# Patient Record
Sex: Male | Born: 1965 | Race: Asian | Hispanic: No | Marital: Married | Smoking: Current some day smoker
Health system: Southern US, Community
[De-identification: ages and names within clinical notes are randomized; demographics above are authoritative.]

## PROBLEM LIST (undated history)

## (undated) DIAGNOSIS — Z720 Tobacco use: Secondary | ICD-10-CM

## (undated) DIAGNOSIS — E119 Type 2 diabetes mellitus without complications: Secondary | ICD-10-CM

## (undated) DIAGNOSIS — I10 Essential (primary) hypertension: Secondary | ICD-10-CM

## (undated) HISTORY — PX: APPENDECTOMY: SHX54

---

## 2004-02-04 ENCOUNTER — Emergency Department (HOSPITAL_COMMUNITY): Admission: EM | Admit: 2004-02-04 | Discharge: 2004-02-04 | Payer: Self-pay | Admitting: *Deleted

## 2017-11-05 ENCOUNTER — Ambulatory Visit
Admission: RE | Admit: 2017-11-05 | Discharge: 2017-11-05 | Disposition: A | Discharge status: Home / Self Care | Payer: BLUE CROSS/BLUE SHIELD | Source: Ambulatory Visit | Attending: Family Medicine | Admitting: Family Medicine

## 2017-11-05 ENCOUNTER — Other Ambulatory Visit: Payer: Self-pay | Admitting: Family Medicine

## 2017-11-05 DIAGNOSIS — R634 Abnormal weight loss: Secondary | ICD-10-CM

## 2017-11-05 DIAGNOSIS — L98499 Non-pressure chronic ulcer of skin of other sites with unspecified severity: Secondary | ICD-10-CM

## 2017-11-09 ENCOUNTER — Other Ambulatory Visit: Payer: Self-pay | Admitting: Family Medicine

## 2017-11-09 DIAGNOSIS — R911 Solitary pulmonary nodule: Secondary | ICD-10-CM

## 2017-11-11 ENCOUNTER — Encounter (HOSPITAL_COMMUNITY): Payer: Self-pay | Admitting: Emergency Medicine

## 2017-11-11 ENCOUNTER — Other Ambulatory Visit: Payer: Self-pay | Admitting: Internal Medicine

## 2017-11-11 ENCOUNTER — Emergency Department (HOSPITAL_COMMUNITY): Payer: BLUE CROSS/BLUE SHIELD

## 2017-11-11 ENCOUNTER — Other Ambulatory Visit: Payer: Self-pay

## 2017-11-11 ENCOUNTER — Inpatient Hospital Stay (HOSPITAL_COMMUNITY)
Admission: EM | Admit: 2017-11-11 | Discharge: 2017-11-15 | DRG: 194 | Disposition: A | Discharge status: Home / Self Care | Payer: BLUE CROSS/BLUE SHIELD | Attending: Internal Medicine | Admitting: Internal Medicine

## 2017-11-11 DIAGNOSIS — R000 Tachycardia, unspecified: Secondary | ICD-10-CM | POA: Diagnosis present

## 2017-11-11 DIAGNOSIS — A150 Tuberculosis of lung: Secondary | ICD-10-CM | POA: Diagnosis present

## 2017-11-11 DIAGNOSIS — R918 Other nonspecific abnormal finding of lung field: Secondary | ICD-10-CM | POA: Diagnosis not present

## 2017-11-11 DIAGNOSIS — R634 Abnormal weight loss: Secondary | ICD-10-CM | POA: Diagnosis not present

## 2017-11-11 DIAGNOSIS — J168 Pneumonia due to other specified infectious organisms: Principal | ICD-10-CM | POA: Diagnosis present

## 2017-11-11 DIAGNOSIS — A158 Other respiratory tuberculosis: Secondary | ICD-10-CM | POA: Diagnosis not present

## 2017-11-11 DIAGNOSIS — Z681 Body mass index (BMI) 19 or less, adult: Secondary | ICD-10-CM | POA: Diagnosis not present

## 2017-11-11 DIAGNOSIS — I10 Essential (primary) hypertension: Secondary | ICD-10-CM | POA: Diagnosis present

## 2017-11-11 DIAGNOSIS — R059 Cough, unspecified: Secondary | ICD-10-CM | POA: Diagnosis present

## 2017-11-11 DIAGNOSIS — F17210 Nicotine dependence, cigarettes, uncomplicated: Secondary | ICD-10-CM | POA: Diagnosis present

## 2017-11-11 DIAGNOSIS — Z716 Tobacco abuse counseling: Secondary | ICD-10-CM

## 2017-11-11 DIAGNOSIS — Z833 Family history of diabetes mellitus: Secondary | ICD-10-CM

## 2017-11-11 DIAGNOSIS — E119 Type 2 diabetes mellitus without complications: Secondary | ICD-10-CM | POA: Diagnosis present

## 2017-11-11 DIAGNOSIS — F17200 Nicotine dependence, unspecified, uncomplicated: Secondary | ICD-10-CM | POA: Diagnosis not present

## 2017-11-11 DIAGNOSIS — E876 Hypokalemia: Secondary | ICD-10-CM | POA: Diagnosis present

## 2017-11-11 DIAGNOSIS — R05 Cough: Secondary | ICD-10-CM | POA: Diagnosis not present

## 2017-11-11 DIAGNOSIS — R5381 Other malaise: Secondary | ICD-10-CM | POA: Diagnosis present

## 2017-11-11 DIAGNOSIS — E440 Moderate protein-calorie malnutrition: Secondary | ICD-10-CM | POA: Diagnosis present

## 2017-11-11 DIAGNOSIS — J189 Pneumonia, unspecified organism: Secondary | ICD-10-CM

## 2017-11-11 DIAGNOSIS — J181 Lobar pneumonia, unspecified organism: Secondary | ICD-10-CM | POA: Diagnosis not present

## 2017-11-11 DIAGNOSIS — Z720 Tobacco use: Secondary | ICD-10-CM | POA: Diagnosis present

## 2017-11-11 HISTORY — DX: Essential (primary) hypertension: I10

## 2017-11-11 HISTORY — DX: Type 2 diabetes mellitus without complications: E11.9

## 2017-11-11 HISTORY — DX: Tobacco use: Z72.0

## 2017-11-11 LAB — URINALYSIS, ROUTINE W REFLEX MICROSCOPIC
BILIRUBIN URINE: NEGATIVE
GLUCOSE, UA: NEGATIVE mg/dL
HGB URINE DIPSTICK: NEGATIVE
KETONES UR: NEGATIVE mg/dL
LEUKOCYTES UA: NEGATIVE
Nitrite: NEGATIVE
PH: 8 (ref 5.0–8.0)
Protein, ur: NEGATIVE mg/dL
Specific Gravity, Urine: 1.002 — ABNORMAL LOW (ref 1.005–1.030)

## 2017-11-11 LAB — CBC WITH DIFFERENTIAL/PLATELET
BASOS ABS: 0.1 10*3/uL (ref 0.0–0.1)
Basophils Relative: 1 %
Eosinophils Absolute: 0.8 10*3/uL — ABNORMAL HIGH (ref 0.0–0.7)
Eosinophils Relative: 8 %
HEMATOCRIT: 34.5 % — AB (ref 39.0–52.0)
Hemoglobin: 11.2 g/dL — ABNORMAL LOW (ref 13.0–17.0)
LYMPHS ABS: 1.7 10*3/uL (ref 0.7–4.0)
LYMPHS PCT: 17 %
MCH: 29.8 pg (ref 26.0–34.0)
MCHC: 32.5 g/dL (ref 30.0–36.0)
MCV: 91.8 fL (ref 78.0–100.0)
MONO ABS: 0.6 10*3/uL (ref 0.1–1.0)
Monocytes Relative: 6 %
Neutro Abs: 6.9 10*3/uL (ref 1.7–7.7)
Neutrophils Relative %: 68 %
Platelets: 488 10*3/uL — ABNORMAL HIGH (ref 150–400)
RBC: 3.76 MIL/uL — ABNORMAL LOW (ref 4.22–5.81)
RDW: 14.6 % (ref 11.5–15.5)
WBC: 10.1 10*3/uL (ref 4.0–10.5)

## 2017-11-11 LAB — COMPREHENSIVE METABOLIC PANEL
ALBUMIN: 3.5 g/dL (ref 3.5–5.0)
ALK PHOS: 72 U/L (ref 38–126)
ALT: 20 U/L (ref 17–63)
AST: 26 U/L (ref 15–41)
Anion gap: 11 (ref 5–15)
BUN: 7 mg/dL (ref 6–20)
CALCIUM: 9.6 mg/dL (ref 8.9–10.3)
CO2: 26 mmol/L (ref 22–32)
CREATININE: 0.84 mg/dL (ref 0.61–1.24)
Chloride: 99 mmol/L — ABNORMAL LOW (ref 101–111)
GFR calc Af Amer: >60 mL/min (ref >60)
GFR calc non Af Amer: >60 mL/min (ref >60)
GLUCOSE: 114 mg/dL — AB (ref 65–99)
Potassium: 3.4 mmol/L — ABNORMAL LOW (ref 3.5–5.1)
SODIUM: 136 mmol/L (ref 135–145)
Total Bilirubin: 0.6 mg/dL (ref 0.3–1.2)
Total Protein: 7.5 g/dL (ref 6.5–8.1)

## 2017-11-11 LAB — LACTIC ACID, PLASMA: Lactic Acid, Venous: 1.2 mmol/L (ref 0.5–1.9)

## 2017-11-11 LAB — CBG MONITORING, ED: GLUCOSE-CAPILLARY: 92 mg/dL (ref 65–99)

## 2017-11-11 LAB — I-STAT TROPONIN, ED: Troponin i, poc: 0 ng/mL (ref 0.00–0.08)

## 2017-11-11 MED ORDER — INSULIN ASPART 100 UNIT/ML ~~LOC~~ SOLN: 0.0000 [IU] | Freq: Every day | SUBCUTANEOUS | Status: DC

## 2017-11-11 MED ORDER — INSULIN ASPART 100 UNIT/ML ~~LOC~~ SOLN
0.0000 [IU] | Freq: Three times a day (TID) | SUBCUTANEOUS | Status: DC
  Administered 2017-11-12 – 2017-11-14 (×3): 2 [IU] via SUBCUTANEOUS
  Administered 2017-11-15: 3 [IU] via SUBCUTANEOUS
  Filled 2017-11-11: qty 1

## 2017-11-11 MED ORDER — ALBUTEROL SULFATE (2.5 MG/3ML) 0.083% IN NEBU
2.5000 mg | INHALATION_SOLUTION | Freq: Four times a day (QID) | RESPIRATORY_TRACT | Status: DC | PRN
Start: 2017-11-11 — End: 2017-11-15

## 2017-11-11 MED ORDER — ENSURE ENLIVE PO LIQD
237.0000 mL | Freq: Two times a day (BID) | ORAL | Status: DC
  Administered 2017-11-12 (×2): 237 mL via ORAL
  Filled 2017-11-11: qty 237

## 2017-11-11 MED ORDER — POTASSIUM CHLORIDE 20 MEQ/15ML (10%) PO SOLN
20.0000 meq | Freq: Once | ORAL | Status: AC
  Administered 2017-11-11: 20 meq via ORAL
  Filled 2017-11-11: qty 15

## 2017-11-11 MED ORDER — ACETAMINOPHEN 325 MG PO TABS: 650.0000 mg | ORAL_TABLET | Freq: Four times a day (QID) | ORAL | Status: DC | PRN

## 2017-11-11 MED ORDER — SODIUM CHLORIDE 0.9 % IV SOLN
500.0000 mg | INTRAVENOUS | Status: DC
  Administered 2017-11-12: 500 mg via INTRAVENOUS
  Filled 2017-11-11 (×3): qty 500

## 2017-11-11 MED ORDER — HYDRALAZINE HCL 20 MG/ML IJ SOLN: 5.0000 mg | INTRAMUSCULAR | Status: DC | PRN

## 2017-11-11 MED ORDER — SODIUM CHLORIDE 0.9 % IV SOLN
INTRAVENOUS | Status: DC
  Administered 2017-11-11 – 2017-11-14 (×5): via INTRAVENOUS

## 2017-11-11 MED ORDER — NICOTINE 21 MG/24HR TD PT24
21.0000 mg | MEDICATED_PATCH | Freq: Every day | TRANSDERMAL | Status: DC
  Filled 2017-11-11 (×2): qty 1

## 2017-11-11 MED ORDER — DM-GUAIFENESIN ER 30-600 MG PO TB12
1.0000 | ORAL_TABLET | Freq: Two times a day (BID) | ORAL | Status: DC | PRN
  Filled 2017-11-11: qty 1

## 2017-11-11 MED ORDER — SODIUM CHLORIDE 0.9 % IV SOLN
1.0000 g | INTRAVENOUS | Status: DC
  Administered 2017-11-12: 1 g via INTRAVENOUS
  Filled 2017-11-11 (×2): qty 10

## 2017-11-11 MED ORDER — ZOLPIDEM TARTRATE 5 MG PO TABS: 5.0000 mg | ORAL_TABLET | Freq: Every evening | ORAL | Status: DC | PRN

## 2017-11-11 MED ORDER — SODIUM CHLORIDE 0.9 % IV BOLUS
1000.0000 mL | Freq: Once | INTRAVENOUS | Status: AC
Start: 2017-11-11 — End: 2017-11-11
  Administered 2017-11-11: 1000 mL via INTRAVENOUS

## 2017-11-11 MED ORDER — SODIUM CHLORIDE 0.9 % IV SOLN
1.0000 g | Freq: Once | INTRAVENOUS | Status: AC
  Administered 2017-11-11: 1 g via INTRAVENOUS
  Filled 2017-11-11: qty 10

## 2017-11-11 MED ORDER — SODIUM CHLORIDE 0.9 % IV SOLN
500.0000 mg | Freq: Once | INTRAVENOUS | Status: AC
  Administered 2017-11-11: 500 mg via INTRAVENOUS
  Filled 2017-11-11: qty 500

## 2017-11-11 MED ORDER — ONDANSETRON HCL 4 MG/2ML IJ SOLN: 4.0000 mg | Freq: Three times a day (TID) | INTRAMUSCULAR | Status: DC | PRN

## 2017-11-11 MED ORDER — ENOXAPARIN SODIUM 40 MG/0.4ML ~~LOC~~ SOLN
40.0000 mg | Freq: Every day | SUBCUTANEOUS | Status: DC
  Administered 2017-11-12 – 2017-11-15 (×4): 40 mg via SUBCUTANEOUS
  Filled 2017-11-11 (×3): qty 0.4

## 2017-11-11 NOTE — ED Provider Notes (Signed)
Patient placed in Quick Look pathway, seen and evaluated   Chief Complaint: Cough  HPI:   Carles Collethong Delmonico is a 52 y.o. male, presenting to the ED with persistent cough, especially in the morning for the last 4 weeks. Patient traveled back to [place] and was in [place] from November 2018 until the end of January 2019.  He also endorses 20 pound weight loss in the last month as well as anorexia.   ROS: Cough, abnormal chest CT (one)  Physical Exam:   Gen: No distress  Neuro: Awake and Alert  Skin: Warm    Focused Exam:   No noted increased work of breathing. General wasting appearance.    CT with contrast performed today through [facility]: IMPRESSION: 1. Consolidations, confluent opacities, and groundglass nodules bilaterally. There is a large consolidation involving the right upper lobe measuring 6.4 x 5.6 cm with central cavitation. Consolidations involving the superior aspect of the right lower  lobe also demonstrate regions of central cavitation. These findings are most consistent with a multifocal pneumonia, potentially fungal or TB   Initiation of care has begun. The patient has been counseled on the process, plan, and necessity for staying for the completion/evaluation, and the remainder of the medical screening examination   Concepcion LivingJoy, Dartanion Teo C, PA-C 11/11/17 1806    Azalia Bilisampos, Kevin, MD 11/11/17 2330

## 2017-11-11 NOTE — H&P (Signed)
History and Physical    Tyler Hansen UJW:[id number] DOB: September 11, 1965 DOA: 11/11/2017  Referring MD/NP/PA:   PCP: Kaleen Mask, MD   Patient coming from:  The patient is coming from home.  At baseline, pt is independent for most of ADL.  Chief Complaint: cough and weight loss  HPI: Tyler Hansen is a 52 y.o. male with medical history significant of  Hypertension, diabetes mellitus, who presents with cough and weight loss.  Pt speaks [place] and dose not speak [place]. The history was obtained using Using [employer] [place]. Patient states that he has been having cough for more than 4 weeks. He does not have fever, but has intermittent chills. Denies any chest pain or SOB. No runny nose, sore throat. He has malaise. He has poor appetite and and lost 20 pounds recently. Pt was seen by his PCP and had CT scan done.   CT with contrast performed today through [facility] showed: Consolidations, confluent opacities, and groundglass nodules bilaterally. There is a large consolidation involving the right upper lobe measuring 6.4 x 5.6 cm with central cavitation. Consolidations involving the superior aspect of the right lower lobe also demonstrate regions of central cavitation. These findings are most consistent with a multifocal pneumonia, potentially fungal or TB  He was sent to ED for further evaluation and treatment. Patient states that he traveled to [place] and stayed there from 06/2017 to the end of 08/2017. Patient does not have nausea, vomiting, diarrhea, abdominal pain. No symptoms of UTI or unilateral weakness. He states that there is no family members having tuberculosis.  ED Course: pt was found to have WBC 10.1, negative troponin, negative urinalysis, potassium 3.4, creatinine normal, temperature 99, tachycardia, tachypnea, oxygen saturation sexual 100% on room air. Chest x-ray showed right upper lobe infiltration. Patient is admitted to MedSurg bed (negative pressure room) as  inpt. Infectious disease, Dr. Luciana Axe was consulted.  Review of Systems:   General: no fevers, has chills, body weight loss, has poor appetite, has fatigue HEENT: no blurry vision, hearing changes or sore throat Respiratory: no dyspnea, has coughing, no wheezing CV: no chest pain, no palpitations GI: no nausea, vomiting, abdominal pain, diarrhea, constipation GU: no dysuria, burning on urination, increased urinary frequency, hematuria  Ext: no leg edema Neuro: no unilateral weakness, numbness, or tingling, no vision change or hearing loss Skin: no rash, no skin tear. MSK: No muscle spasm, no deformity, no limitation of range of movement in spin Heme: No easy bruising.  Travel history: No recent long distant travel.  Allergy: No Known Allergies  Past Medical History:  Diagnosis Date  . Diabetes mellitus without complication (HCC)   . Hypertension   . Tobacco abuse     Past Surgical History:  Procedure Laterality Date  . APPENDECTOMY  20 years ago    Social History:  reports that he has been smoking.  He has never used smokeless tobacco. He reports that he drank alcohol. He reports that he does not use drugs.  Family History:  Family History  Problem Relation Age of Onset  . Diabetes Mother   . Diabetes Father      Prior to Admission medications   Not on File    Physical Exam: Vitals:   11/11/17 1636 11/11/17 1700  BP: (!) 134/94   Pulse: (!) 116   Resp: 16   Temp: 99 F (37.2 C)   TempSrc: Oral   SpO2: 100%   Weight:  50.8 kg (112 lb)  Height:  5\' 6"  (  1.676 m)   General: Not in acute distress HEENT:       Eyes: PERRL, EOMI, no scleral icterus.       ENT: No discharge from the ears and nose, no pharynx injection, no tonsillar enlargement.        Neck: No JVD, no bruit, no mass felt. Heme: No neck lymph node enlargement. Cardiac: S1/S2, RRR, No murmurs, No gallops or rubs. Respiratory: No rales, wheezing, rhonchi or rubs. GI: Soft, nondistended, nontender,  no rebound pain, no organomegaly, BS present. GU: No hematuria Ext: No pitting leg edema bilaterally. 2+DP/PT pulse bilaterally. Musculoskeletal: No joint deformities, No joint redness or warmth, no limitation of ROM in spin. Skin: No rashes.  Neuro: Alert, oriented X3, cranial nerves II-XII grossly intact, moves all extremities normally.  Psych: Patient is not psychotic, no suicidal or hemocidal ideation.  Labs on Admission: I have personally reviewed following labs and imaging studies  CBC: Recent Labs  Lab 11/11/17 1721  WBC 10.1  NEUTROABS 6.9  HGB 11.2*  HCT 34.5*  MCV 91.8  PLT 488*   Basic Metabolic Panel: Recent Labs  Lab 11/11/17 1721  NA 136  K 3.4*  CL 99*  CO2 26  GLUCOSE 114*  BUN 7  CREATININE 0.84  CALCIUM 9.6   GFR: Estimated Creatinine Clearance: 73.1 mL/min (by C-G formula based on SCr of 0.84 mg/dL). Liver Function Tests: Recent Labs  Lab 11/11/17 1721  AST 26  ALT 20  ALKPHOS 72  BILITOT 0.6  PROT 7.5  ALBUMIN 3.5   No results for input(s): LIPASE, AMYLASE in the last 168 hours. No results for input(s): AMMONIA in the last 168 hours. Coagulation Profile: No results for input(s): INR, PROTIME in the last 168 hours. Cardiac Enzymes: No results for input(s): CKTOTAL, CKMB, CKMBINDEX, TROPONINI in the last 168 hours. BNP (last 3 results) No results for input(s): PROBNP in the last 8760 hours. HbA1C: No results for input(s): HGBA1C in the last 72 hours. CBG: No results for input(s): GLUCAP in the last 168 hours. Lipid Profile: No results for input(s): CHOL, HDL, LDLCALC, TRIG, CHOLHDL, LDLDIRECT in the last 72 hours. Thyroid Function Tests: No results for input(s): TSH, T4TOTAL, FREET4, T3FREE, THYROIDAB in the last 72 hours. Anemia Panel: No results for input(s): VITAMINB12, FOLATE, FERRITIN, TIBC, IRON, RETICCTPCT in the last 72 hours. Urine analysis:    Component Value Date/Time   COLORURINE STRAW (A) 11/11/2017 1744    APPEARANCEUR CLEAR 11/11/2017 1744   LABSPEC 1.002 (L) 11/11/2017 1744   PHURINE 8.0 11/11/2017 1744   GLUCOSEU NEGATIVE 11/11/2017 1744   HGBUR NEGATIVE 11/11/2017 1744   BILIRUBINUR NEGATIVE 11/11/2017 1744   KETONESUR NEGATIVE 11/11/2017 1744   PROTEINUR NEGATIVE 11/11/2017 1744   NITRITE NEGATIVE 11/11/2017 1744   LEUKOCYTESUR NEGATIVE 11/11/2017 1744   Sepsis Labs: @LABRCNTIP (procalcitonin:4,lacticidven:4) )No results found for this or any previous visit (from the past 240 hour(s)).   Radiological Exams on Admission: Dg Chest Portable 1 View  Result Date: 11/11/2017 CLINICAL DATA:  Cough for 4 weeks. 20 pound weight loss. Recent travel to [place] in November 2018. EXAM: PORTABLE CHEST 1 VIEW COMPARISON:  11/05/2017 FINDINGS: There is persistent right upper lobe airspace consolidation and volume loss with retraction of the right hilum. Possible right hilar adenopathy. Multiple other nodular densities are noted in the left lung. No significant change. Slight improved aeration of the right lung base. No pleural effusions. IMPRESSION: Persistent right upper lobe airspace process and scattered nodular lung opacities. Findings could be  due to atypical infection such as TB. Neoplasm is also possible. Electronically Signed   By: Rudie Meyer M.D.   On: 11/11/2017 18:24     EKG: Independently reviewed.  Sinus rhythm, QTC 472, no ischemic change.  Assessment/Plan Principal Problem:   Cough Active Problems:   Hypertension   Diabetes mellitus without complication (HCC)   Weight loss   Tobacco abuse   Protein-calorie malnutrition, moderate (HCC)   Hypokalemia   Cough: pt has been coughing for 4 weeks with significant weight loss. CT of the chest showed infiltration with cavity, very concerning for TB, atypical pneumonia or fungal pneumonia. Infectious disease, Dr. Aundria Mems was consulted by EDP. He recommended symptomatic treatment at this time will cover with azithromycin and Rocephin and  get AFB sputum cultures as well as QuantiFERON. Patient does not have leukocytosis and fever. He has tachycardia, but clinically does not seem to be septic. Hemodynamically stable.  - will admit to Med-surg as inpt (negative pressure room - IV Rocephin and azithromycin - Mucinex for cough  - prn Albuterol Nebs for SOB - Urine legionella and S. pneumococcal antigen - Follow up blood culture x2, sputum culture and respiratory virus panel, plus Flu pcr - lactic acid level  - IVF: 1.0 L of NS bolus in ED, followed by 125 mL per hour of NS  -f/u  AFB sputum cultures  And QuantiFERON.  HTN: Patient states that he isn taking blood pressure medications at home, but doesn't know the name of medication. Blood pressure is 124/94 -when necessary hydralazine IV  Diabetes mellitus without complication (HCC): no A1c on record.  Patient states that he isn taking DM  medications at home, but doesn't know the name of medication. -SSI -check a1c  Weight loss and Protein-calorie malnutrition, moderate: -Ensure and consult nutrition  Tobacco abuse: -Did counseling about importance of quitting smoking -Nicotine patch  Hypokalemia: K= 3.4 on admission. - Repleted  DVT ppx: SQ Lovenox Code Status: Full code Family Communication:   Yes, patient's  wife  at bed side Disposition Plan:  Anticipate discharge back to previous home environment Consults called:  ID, Dr. Luciana Axe Admission status:  medical floor/inpt  Date of Service 11/11/2017    Lorretta Harp [facility] Pager [phone]  If 7PM-7AM, please contact night-coverage [email or web address] Password [email or web address] 11/11/2017, 8:20 PM

## 2017-11-11 NOTE — ED Triage Notes (Signed)
Using [place] [employer]. Pt states that his doctor recommended him to come here because he has an infection in his lungs. Pt states he went to the doctor because he was rapid losing weight (20 lbs) and poor appetite so his doctor did some blood work and then sent for a scan after. He has had a cough in the morning for 4 weeks,  denies pain or shortness of breath at this time.

## 2017-11-11 NOTE — ED Provider Notes (Signed)
[facility] EMERGENCY DEPARTMENT Provider Note   CSN: [id number] Arrival date & time: 11/11/17  1534     History   Chief Complaint Chief Complaint  Patient presents with  . Abnormal Imaging    HPI Tyler Hansen is a 52 y.o. male.  Patient is a 52 year old [place] with a past medical history of diabetes and hypertension who is presenting today with a history of 1 month of cough with questionable sputum, general malaise, 20 pound weight loss, anorexia.  Patient was seen by his PCP earlier this week and had x-rays that showed questionable cavitary lesion then went on to have a CT done today which showed consolidation with cavitary lesion questionable multifocal pneumonia versus TB versus fungal infection.  Significant history was patient travel back to [place] from November through January.  Patient was not ill while he was in country and illness developed once he returned to the [place].  Patient does smoke cigarettes.  He has shortness of breath with exertion but denies resting dyspnea or chest pain.  Patient has a minimal white sputum production but denies any hemoptysis  The history is provided by the patient. The history is limited by a language barrier. A language interpreter was used.    Past Medical History:  Diagnosis Date  . Diabetes mellitus without complication (HCC)   . Hypertension     There are no active problems to display for this patient.   Past Surgical History:  Procedure Laterality Date  . APPENDECTOMY  20 years ago        Home Medications    Prior to Admission medications   Not on File    Family History Family History  Problem Relation Age of Onset  . Diabetes Mother   . Diabetes Father     Social History Social History   Tobacco Use  . Smoking status: Current Some Day Smoker  . Smokeless tobacco: Never Used  Substance Use Topics  . Alcohol use: Not Currently  . Drug use: Not Currently     Allergies     Patient has no allergy information on record.   Review of Systems Review of Systems  All other systems reviewed and are negative.    Physical Exam Updated Vital Signs BP (!) 134/94 (BP Location: Right Arm)   Pulse (!) 116   Temp 99 F (37.2 C) (Oral)   Resp 16   Ht 5\' 6"  (1.676 m)   Wt 50.8 kg (112 lb)   SpO2 100%   BMI 18.08 kg/m   Physical Exam  Constitutional: He is oriented to person, place, and time. He appears well-developed. He appears cachectic. No distress.  HENT:  Head: Normocephalic and atraumatic.  Mouth/Throat: Oropharynx is clear and moist.  Eyes: Pupils are equal, round, and reactive to light. Conjunctivae and EOM are normal.  Neck: Normal range of motion. Neck supple.  Cardiovascular: Regular rhythm and intact distal pulses. Tachycardia present.  No murmur heard. Pulmonary/Chest: Effort normal. No respiratory distress. He has no wheezes. He has rhonchi in the right upper field. He has no rales.  Abdominal: Soft. He exhibits no distension. There is no tenderness. There is no rebound and no guarding.  Musculoskeletal: Normal range of motion. He exhibits no edema or tenderness.  Neurological: He is alert and oriented to person, place, and time.  Skin: Skin is warm and dry. No rash noted. No erythema.  Psychiatric: He has a normal mood and affect. His behavior is normal.  Nursing  note and vitals reviewed.    ED Treatments / Results  Labs (all labs ordered are listed, but only abnormal results are displayed) Labs Reviewed  CBC WITH DIFFERENTIAL/PLATELET - Abnormal; Notable for the following components:      Result Value   RBC 3.76 (*)    Hemoglobin 11.2 (*)    HCT 34.5 (*)    Platelets 488 (*)    Eosinophils Absolute 0.8 (*)    All other components within normal limits  COMPREHENSIVE METABOLIC PANEL - Abnormal; Notable for the following components:   Potassium 3.4 (*)    Chloride 99 (*)    Glucose, Bld 114 (*)    All other components within normal  limits  CULTURE, BLOOD (ROUTINE X 2)  CULTURE, BLOOD (ROUTINE X 2)  URINE CULTURE  ACID FAST SMEAR (AFB)  ACID FAST CULTURE WITH REFLEXED SENSITIVITIES  FUNGUS CULTURE WITH STAIN  CULTURE, EXPECTORATED SPUTUM-ASSESSMENT  HIV ANTIBODY (ROUTINE TESTING)  URINALYSIS, ROUTINE W REFLEX MICROSCOPIC  QUANTIFERON-TB GOLD PLUS  I-STAT TROPONIN, ED  I-STAT CG4 LACTIC ACID, ED    EKG EKG Interpretation  Date/Time:  Thursday November 11 2017 18:37:54 EDT Ventricular Rate:  101 PR Interval:    QRS Duration: 88 QT Interval:  364 QTC Calculation: 472 R Axis:   63 Text Interpretation:  Sinus tachycardia Baseline wander in lead(s) V3 No previous tracing Confirmed by Gwyneth Sprout ([id number]) on 11/11/2017 6:59:35 PM   Radiology Dg Chest Portable 1 View  Result Date: 11/11/2017 CLINICAL DATA:  Cough for 4 weeks. 20 pound weight loss. Recent travel to [place] in November 2018. EXAM: PORTABLE CHEST 1 VIEW COMPARISON:  11/05/2017 FINDINGS: There is persistent right upper lobe airspace consolidation and volume loss with retraction of the right hilum. Possible right hilar adenopathy. Multiple other nodular densities are noted in the left lung. No significant change. Slight improved aeration of the right lung base. No pleural effusions. IMPRESSION: Persistent right upper lobe airspace process and scattered nodular lung opacities. Findings could be due to atypical infection such as TB. Neoplasm is also possible. Electronically Signed   By: Rudie Meyer M.D.   On: 11/11/2017 18:24    Procedures Procedures (including critical care time)  Medications Ordered in ED Medications  cefTRIAXone (ROCEPHIN) 1 g in sodium chloride 0.9 % 100 mL IVPB (has no administration in time range)  azithromycin (ZITHROMAX) 500 mg in sodium chloride 0.9 % 250 mL IVPB (has no administration in time range)     Initial Impression / Assessment and Plan / ED Course  I have reviewed the triage vital signs and the nursing  notes.  Pertinent labs & imaging results that were available during my care of the patient were reviewed by me and considered in my medical decision making (see chart for details).  Clinical Course as of Nov 11 1804  Thu Nov 11, 2017  1721 Spoke with Dr. Luciana Axe, infectious disease.  Differential includes multifocal pneumonia, fungal pneumonia, cancer, or TB. Recommends fungal, bacterial, and AFB sputum cultures.  QuantiFERON. Otherwise assess and treat clinically.  He is happy to follow-up with the patient in the office following discharge.   [SJ]    Clinical Course User Index [SJ] Joy, Shawn C, PA-C    Patient presenting today with a known cavitary lesion in his right upper lobe question for multifocal pneumonia versus fungal pneumonia versus TB.  Patient does have significant risk factors for TB as he spent November through January and [place] in addition to being a smoker, and  diabetic.  Spoke with Dr. Luciana Axeomer with infectious disease who recommended symptomatic treatment at this time will cover with azithromycin and Rocephin.  However also recommended doing AFB sputum cultures as well as a QuantiFERON.  Patient was placed in a negative pressure room.  Labs are pending but plan will be to admit.  7:11 PM Cbc wnl, trop neg, CMP wnl.  Started on rocephin and azithro  Final Clinical Impressions(s) / ED Diagnoses   Final diagnoses:  Multifocal pneumonia    ED Discharge Orders    None       Gwyneth SproutPlunkett, Nesa Distel, MD 11/11/17 [phone]

## 2017-11-12 DIAGNOSIS — R05 Cough: Secondary | ICD-10-CM

## 2017-11-12 LAB — BASIC METABOLIC PANEL
ANION GAP: 9 (ref 5–15)
BUN: 5 mg/dL — ABNORMAL LOW (ref 6–20)
CHLORIDE: 106 mmol/L (ref 101–111)
CO2: 23 mmol/L (ref 22–32)
Calcium: 8.7 mg/dL — ABNORMAL LOW (ref 8.9–10.3)
Creatinine, Ser: 0.71 mg/dL (ref 0.61–1.24)
GFR calc Af Amer: >60 mL/min (ref >60)
GLUCOSE: 186 mg/dL — AB (ref 65–99)
POTASSIUM: 3.7 mmol/L (ref 3.5–5.1)
Sodium: 138 mmol/L (ref 135–145)

## 2017-11-12 LAB — RESPIRATORY PANEL BY PCR
ADENOVIRUS-RVPPCR: NOT DETECTED
Bordetella pertussis: NOT DETECTED
CHLAMYDOPHILA PNEUMONIAE-RVPPCR: NOT DETECTED
CORONAVIRUS NL63-RVPPCR: NOT DETECTED
Coronavirus 229E: NOT DETECTED
Coronavirus HKU1: NOT DETECTED
Coronavirus OC43: NOT DETECTED
Influenza A: NOT DETECTED
Influenza B: NOT DETECTED
MYCOPLASMA PNEUMONIAE-RVPPCR: NOT DETECTED
Metapneumovirus: NOT DETECTED
PARAINFLUENZA VIRUS 1-RVPPCR: NOT DETECTED
Parainfluenza Virus 2: NOT DETECTED
Parainfluenza Virus 3: NOT DETECTED
Parainfluenza Virus 4: NOT DETECTED
Respiratory Syncytial Virus: NOT DETECTED
Rhinovirus / Enterovirus: NOT DETECTED

## 2017-11-12 LAB — CBC
HEMATOCRIT: 31.6 % — AB (ref 39.0–52.0)
HEMOGLOBIN: 10.1 g/dL — AB (ref 13.0–17.0)
MCH: 29.2 pg (ref 26.0–34.0)
MCHC: 32 g/dL (ref 30.0–36.0)
MCV: 91.3 fL (ref 78.0–100.0)
Platelets: 404 10*3/uL — ABNORMAL HIGH (ref 150–400)
RBC: 3.46 MIL/uL — AB (ref 4.22–5.81)
RDW: 14.5 % (ref 11.5–15.5)
WBC: 10.2 10*3/uL (ref 4.0–10.5)

## 2017-11-12 LAB — EXPECTORATED SPUTUM ASSESSMENT W REFEX TO RESP CULTURE: SPECIAL REQUESTS: NORMAL

## 2017-11-12 LAB — STREP PNEUMONIAE URINARY ANTIGEN: Strep Pneumo Urinary Antigen: NEGATIVE

## 2017-11-12 LAB — URINE CULTURE: Culture: NO GROWTH

## 2017-11-12 LAB — HIV ANTIBODY (ROUTINE TESTING W REFLEX): HIV Screen 4th Generation wRfx: NONREACTIVE

## 2017-11-12 LAB — EXPECTORATED SPUTUM ASSESSMENT W GRAM STAIN, RFLX TO RESP C: Special Requests: NORMAL

## 2017-11-12 LAB — INFLUENZA PANEL BY PCR (TYPE A & B)
Influenza A By PCR: NEGATIVE
Influenza B By PCR: NEGATIVE

## 2017-11-12 LAB — CBG MONITORING, ED
Glucose-Capillary: 97 mg/dL (ref 65–99)
Glucose-Capillary: 156 mg/dL — ABNORMAL HIGH (ref 65–99)

## 2017-11-12 LAB — GLUCOSE, CAPILLARY
GLUCOSE-CAPILLARY: 193 mg/dL — AB (ref 65–99)
Glucose-Capillary: 173 mg/dL — ABNORMAL HIGH (ref 65–99)

## 2017-11-12 LAB — HEMOGLOBIN A1C
Hgb A1c MFr Bld: 4.8 % (ref 4.8–5.6)
MEAN PLASMA GLUCOSE: 91.06 mg/dL

## 2017-11-12 LAB — TSH: TSH: 1.146 u[IU]/mL (ref 0.350–4.500)

## 2017-11-12 MED ORDER — ENSURE ENLIVE PO LIQD
237.0000 mL | Freq: Three times a day (TID) | ORAL | Status: DC
  Administered 2017-11-12 – 2017-11-15 (×7): 237 mL via ORAL

## 2017-11-12 NOTE — Progress Notes (Signed)
Initial Nutrition Assessment  DOCUMENTATION CODES:   Underweight, Non-severe (moderate) malnutrition in context of acute illness/injury  INTERVENTION:   Ensure Enlive po TID, each supplement provides 350 kcal and 20 grams of protein  NUTRITION DIAGNOSIS:   Moderate Malnutrition related to acute illness as evidenced by mild fat depletion, moderate fat depletion, moderate muscle depletion.  GOAL:   Patient will meet greater than or equal to 90% of their needs  MONITOR:   PO intake, Supplement acceptance, Weight trends, Labs  REASON FOR ASSESSMENT:   Consult Assessment of nutrition requirement/status  ASSESSMENT:   Patient with PMH significant for HTN and DM. Presents this admission with complaints of cough and weight loss. CT of the chest showed infiltration with cavity, very concerning for TB, atypical pneumonia or fungal pneumonia.    Spoke with pt at bedside. Reports appetite has decreased over the last four weeks due to worsening cough. States during this time period he ate chicken, beef, rice, and a lot of soup 1-2 times per day.Pt denies any nausea/vomiting. Pt has not had first meal this admission but RD observed Ensure Enlive at bedside 100% completed. Pt would like to continue with Ensure this stay. RD to increase Ensure to three times per day.  Pt endorses a UBW of 132 lb and a recent wt loss of 20 lb in the last four weeks. Records are limited in weight history making it hard to quantify actual weight loss. Will contiue to monitor trends. Nutrition-Focused physical exam completed.    Medications reviewed and include: SSI, IV abx Labs reviewed: BUN 5 (L)  NUTRITION - FOCUSED PHYSICAL EXAM:    Most Recent Value  Orbital Region  Mild depletion  Upper Arm Region  Moderate depletion  Thoracic and Lumbar Region  Unable to assess  Buccal Region  Mild depletion  Temple Region  Mild depletion  Clavicle Bone Region  Mild depletion  Clavicle and Acromion Bone Region  Mild  depletion  Scapular Bone Region  Unable to assess  Dorsal Hand  Mild depletion  Patellar Region  Moderate depletion  Anterior Thigh Region  Moderate depletion  Posterior Calf Region  Moderate depletion  Edema (RD Assessment)  None  Hair  Reviewed  Eyes  Reviewed  Mouth  Reviewed  Skin  Reviewed  Nails  Reviewed     Diet Order:  Diet heart healthy/carb modified Room service appropriate? Yes; Fluid consistency: Thin  EDUCATION NEEDS:   Education needs have been addressed  Skin:  Skin Assessment: Reviewed RN Assessment  Last BM:  PTA  Height:   Ht Readings from Last 1 Encounters:  11/11/17 5\' 6"  (1.676 m)    Weight:   Wt Readings from Last 1 Encounters:  11/11/17 112 lb (50.8 kg)    Ideal Body Weight:  64.5 kg  BMI:  Body mass index is 18.08 kg/m.  Estimated Nutritional Needs:   Kcal:  1750-1950 kcal  Protein:  85-95 g  Fluid:  >1.7 L/day    Tyler Hansen RD, LDN Clinical Nutrition Pager # - [phone]

## 2017-11-12 NOTE — ED Notes (Signed)
This RN called Infection Prevention office and spoke with Tonya.  Informed Tonya of pt's room change to 5N-32

## 2017-11-12 NOTE — ED Notes (Signed)
Heart Healthy/ Carb Modified Diet was ordered for Lunch. 

## 2017-11-12 NOTE — ED Notes (Signed)
Phlebotomy called to get blood drawn 

## 2017-11-12 NOTE — ED Notes (Signed)
Pt transported at this time via 5N staff

## 2017-11-12 NOTE — Progress Notes (Signed)
PROGRESS NOTE    Tyler Hansen  ZOX:[id number] DOB: January 19, 1966 DOA: 11/11/2017 PCP: Kaleen Mask, MD   Brief Narrative:Tyler Hansen is a 52 y.o. male with medical history significant of  Hypertension, diabetes mellitus, who presents with cough and weight loss.  Pt speaks [place] and dose not speak [place]. The history was obtained using Using [employer] [place]. Patient states that he has been having cough for more than 4 weeks. He does not have fever, but has intermittent chills. Denies any chest pain or SOB. No runny nose, sore throat. He has malaise. He has poor appetite and and lost 20 pounds recently. Pt was seen by his PCP and had CT scan done.   CT with contrast performed today through [facility] showed: Consolidations, confluent opacities, and groundglass nodules bilaterally. There is a large consolidation involving the right upper lobe measuring 6.4 x 5.6 cm with central cavitation. Consolidations involving the superior aspect of the right lower lobe also demonstrate regions of central cavitation. These findings are most consistent with a multifocal pneumonia, potentially fungal or TB  He was sent to ED for further evaluation and treatment. Patient states that he traveled to [place] and stayed there from 06/2017 to the end of 08/2017. Patient does not have nausea, vomiting, diarrhea, abdominal pain. No symptoms of UTI or unilateral weakness. He states that there is no family members having tuberculosis.      Assessment & Plan:   Principal Problem:   Cough Active Problems:   Hypertension   Diabetes mellitus without complication (HCC)   Weight loss   Tobacco abuse   Protein-calorie malnutrition, moderate (HCC)   Hypokalemia  1-PNA, with cavitation ID consulted.  Continue with ceftriaxone and azithromycin.  Follow AFB and Quantiferon.  Urine legionella and S. pneumococcal antigen Influenza and respiratory panel negative.  HI non reactive.   HTN; PRN  hydralazine.   DM; SSI. Follow Hb a1c.  HB A1c 4.8  Moderate protein malnutrition.  Ensure.   Tobacco abuse. Counseled.    DVT prophylaxis: Lovenox.  Code Status: full code.  Family Communication:no family at bedside.  Disposition Plan: to be determine   Consultants:  ID.   Procedures:   none   Antimicrobials:  Ceftriaxone  Azithromycin.    Subjective: He report cough , breathing ok  Objective: Vitals:   11/12/17 0845 11/12/17 1000 11/12/17 1200 11/12/17 1500  BP:  (!) 133/93 132/85 (!) 143/88  Pulse: 94 92 87 93  Resp:    16  Temp:    98.4 F (36.9 C)  TempSrc:    Oral  SpO2: 100% 100% 100% 100%  Weight:      Height:        Intake/Output Summary (Last 24 hours) at 11/12/2017 1603 Last data filed at 11/12/2017 1530 Gross per 24 hour  Intake 3268.75 ml  Output 800 ml  Net 2468.75 ml   Filed Weights   11/11/17 1700  Weight: 50.8 kg (112 lb)    Examination:  General exam: Appears calm and comfortable , thin appearing.  Respiratory system: Clear to auscultation. Respiratory effort normal. Cardiovascular system: S1 & S2 heard, RRR. No JVD, murmurs, rubs, gallops or clicks. No pedal edema. Gastrointestinal system: Abdomen is nondistended, soft and nontender. No organomegaly or masses felt. Normal bowel sounds heard. Central nervous system: Alert Extremities: Symmetric 5 x 5 power. Skin: No rashes, lesions or ulcers   Data Reviewed: I have personally reviewed following labs and imaging studies  CBC: Recent Labs  Lab 11/11/17 1721  11/12/17 1103  WBC 10.1 10.2  NEUTROABS 6.9  --   HGB 11.2* 10.1*  HCT 34.5* 31.6*  MCV 91.8 91.3  PLT 488* 404*   Basic Metabolic Panel: Recent Labs  Lab 11/11/17 1721 11/12/17 1103  NA 136 138  K 3.4* 3.7  CL 99* 106  CO2 26 23  GLUCOSE 114* 186*  BUN 7 5*  CREATININE 0.84 0.71  CALCIUM 9.6 8.7*   GFR: Estimated Creatinine Clearance: 76.7 mL/min (by C-G formula based on SCr of 0.71 mg/dL). Liver  Function Tests: Recent Labs  Lab 11/11/17 1721  AST 26  ALT 20  ALKPHOS 72  BILITOT 0.6  PROT 7.5  ALBUMIN 3.5   No results for input(s): LIPASE, AMYLASE in the last 168 hours. No results for input(s): AMMONIA in the last 168 hours. Coagulation Profile: No results for input(s): INR, PROTIME in the last 168 hours. Cardiac Enzymes: No results for input(s): CKTOTAL, CKMB, CKMBINDEX, TROPONINI in the last 168 hours. BNP (last 3 results) No results for input(s): PROBNP in the last 8760 hours. HbA1C: Recent Labs    11/12/17 0550  HGBA1C 4.8   CBG: Recent Labs  Lab 11/11/17 2113 11/12/17 0742 11/12/17 1131  GLUCAP 92 97 156*   Lipid Profile: No results for input(s): CHOL, HDL, LDLCALC, TRIG, CHOLHDL, LDLDIRECT in the last 72 hours. Thyroid Function Tests: Recent Labs    11/12/17 0550  TSH 1.146   Anemia Panel: No results for input(s): VITAMINB12, FOLATE, FERRITIN, TIBC, IRON, RETICCTPCT in the last 72 hours. Sepsis Labs: Recent Labs  Lab 11/11/17 2004  LATICACIDVEN 1.2    Recent Results (from the past 240 hour(s))  Blood culture (routine x 2)     Status: None (Preliminary result)   Collection Time: 11/11/17  6:45 PM  Result Value Ref Range Status   Specimen Description BLOOD LEFT WRIST  Final   Special Requests   Final    BOTTLES DRAWN AEROBIC AND ANAEROBIC Blood Culture adequate volume   Culture   Final    NO GROWTH < 24 HOURS Performed at [facility], [address], [place], [place] [address]    Report Status PENDING  Incomplete  Urine culture     Status: None   Collection Time: 11/11/17  6:52 PM  Result Value Ref Range Status   Specimen Description URINE, CLEAN CATCH  Final   Special Requests NONE  Final   Culture   Final    NO GROWTH Performed at [facility], [address], [place], [place] [address]    Report Status 11/12/2017 FINAL  Final  Blood culture (routine x 2)     Status: None (Preliminary result)   Collection Time:  11/11/17  8:22 PM  Result Value Ref Range Status   Specimen Description BLOOD RIGHT FOREARM  Final   Special Requests   Final    BOTTLES DRAWN AEROBIC AND ANAEROBIC Blood Culture adequate volume   Culture   Final    NO GROWTH < 24 HOURS Performed at [facility], [address], [place], [place] [address]    Report Status PENDING  Incomplete  Respiratory Panel by PCR     Status: None   Collection Time: 11/11/17 10:12 PM  Result Value Ref Range Status   Adenovirus NOT DETECTED NOT DETECTED Final   Coronavirus 229E NOT DETECTED NOT DETECTED Final   Coronavirus HKU1 NOT DETECTED NOT DETECTED Final   Coronavirus NL63 NOT DETECTED NOT DETECTED Final  Coronavirus OC43 NOT DETECTED NOT DETECTED Final   Metapneumovirus NOT DETECTED NOT DETECTED Final   Rhinovirus / Enterovirus NOT DETECTED NOT DETECTED Final   Influenza A NOT DETECTED NOT DETECTED Final   Influenza B NOT DETECTED NOT DETECTED Final   Parainfluenza Virus 1 NOT DETECTED NOT DETECTED Final   Parainfluenza Virus 2 NOT DETECTED NOT DETECTED Final   Parainfluenza Virus 3 NOT DETECTED NOT DETECTED Final   Parainfluenza Virus 4 NOT DETECTED NOT DETECTED Final   Respiratory Syncytial Virus NOT DETECTED NOT DETECTED Final   Bordetella pertussis NOT DETECTED NOT DETECTED Final   Chlamydophila pneumoniae NOT DETECTED NOT DETECTED Final   Mycoplasma pneumoniae NOT DETECTED NOT DETECTED Final    Comment: Performed at [facility], [address], [place], [place] [address]  Culture, expectorated sputum-assessment     Status: None   Collection Time: 11/12/17  7:47 AM  Result Value Ref Range Status   Specimen Description SPUTUM  Final   Special Requests Normal  Final   Sputum evaluation   Final    Sputum specimen not acceptable for testing.  Please recollect.   Results Called to: Tobey Grim, ED CHARGE RN AT 0900 ON 11/12/17 BY C. JESSUP, MLT. Performed at [facility], [address], [place], [place] [address]     Report Status 11/12/2017 FINAL  Final  Culture, expectorated sputum-assessment     Status: None   Collection Time: 11/12/17  9:57 AM  Result Value Ref Range Status   Specimen Description SPUTUM  Final   Special Requests Normal  Final   Sputum evaluation   Final    THIS SPECIMEN IS ACCEPTABLE FOR SPUTUM CULTURE Performed at [facility], [address], [place], [place] [address]    Report Status 11/12/2017 FINAL  Final  Culture, respiratory (NON-Expectorated)     Status: None (Preliminary result)   Collection Time: 11/12/17  9:57 AM  Result Value Ref Range Status   Specimen Description SPUTUM  Final   Special Requests Normal Reflexed from [id number]  Final   Gram Stain   Final    ABUNDANT WBC PRESENT, PREDOMINANTLY PMN RARE SQUAMOUS EPITHELIAL CELLS PRESENT RARE GRAM POSITIVE COCCI IN PAIRS RARE GRAM NEGATIVE RODS Performed at [facility], [address] [place], [place] [address]    Culture PENDING  Incomplete   Report Status PENDING  Incomplete         Radiology Studies: Dg Chest Portable 1 View  Result Date: 11/11/2017 CLINICAL DATA:  Cough for 4 weeks. 20 pound weight loss. Recent travel to [place] in November 2018. EXAM: PORTABLE CHEST 1 VIEW COMPARISON:  11/05/2017 FINDINGS: There is persistent right upper lobe airspace consolidation and volume loss with retraction of the right hilum. Possible right hilar adenopathy. Multiple other nodular densities are noted in the left lung. No significant change. Slight improved aeration of the right lung base. No pleural effusions. IMPRESSION: Persistent right upper lobe airspace process and scattered nodular lung opacities. Findings could be due to atypical infection such as TB. Neoplasm is also possible. Electronically Signed   By: Rudie Meyer M.D.   On: 11/11/2017 18:24        Scheduled Meds: . enoxaparin (LOVENOX) injection  40 mg Subcutaneous Daily  . feeding supplement (ENSURE ENLIVE)  237 mL Oral TID BM  .  insulin aspart  0-5 Units Subcutaneous QHS  . insulin aspart  0-9 Units Subcutaneous TID WC  . nicotine  21 mg Transdermal  Daily   Continuous Infusions: . sodium chloride 125 mL/hr at 11/11/17 2121  . azithromycin    . cefTRIAXone (ROCEPHIN)  IV       LOS: 1 day    Time spent:35 minutes.     Alba CoryBelkys A Jevaughn Degollado, MD [facility] Pager [phone]  If 7PM-7AM, please contact night-coverage [email or web address] Password [email or web address] 11/12/2017, 4:03 PM

## 2017-11-12 NOTE — Plan of Care (Signed)
°  Problem: Activity: °Goal: Risk for activity intolerance will decrease °Outcome: Progressing °  °Problem: Nutrition: °Goal: Adequate nutrition will be maintained °Outcome: Progressing °  °Problem: Elimination: °Goal: Will not experience complications related to bowel motility °Outcome: Progressing °  °Problem: Safety: °Goal: Ability to remain free from injury will improve °Outcome: Progressing °  °

## 2017-11-13 DIAGNOSIS — F17200 Nicotine dependence, unspecified, uncomplicated: Secondary | ICD-10-CM

## 2017-11-13 DIAGNOSIS — R918 Other nonspecific abnormal finding of lung field: Secondary | ICD-10-CM

## 2017-11-13 DIAGNOSIS — J181 Lobar pneumonia, unspecified organism: Secondary | ICD-10-CM

## 2017-11-13 LAB — LEGIONELLA PNEUMOPHILA SEROGP 1 UR AG: L. PNEUMOPHILA SEROGP 1 UR AG: NEGATIVE

## 2017-11-13 LAB — CBC
HCT: 30.5 % — ABNORMAL LOW (ref 39.0–52.0)
HEMOGLOBIN: 9.7 g/dL — AB (ref 13.0–17.0)
MCH: 29 pg (ref 26.0–34.0)
MCHC: 31.8 g/dL (ref 30.0–36.0)
MCV: 91 fL (ref 78.0–100.0)
Platelets: 437 10*3/uL — ABNORMAL HIGH (ref 150–400)
RBC: 3.35 MIL/uL — ABNORMAL LOW (ref 4.22–5.81)
RDW: 14.4 % (ref 11.5–15.5)
WBC: 9.2 10*3/uL (ref 4.0–10.5)

## 2017-11-13 LAB — GLUCOSE, CAPILLARY
GLUCOSE-CAPILLARY: 89 mg/dL (ref 65–99)
GLUCOSE-CAPILLARY: 180 mg/dL — AB (ref 65–99)
Glucose-Capillary: 159 mg/dL — ABNORMAL HIGH (ref 65–99)

## 2017-11-13 LAB — BASIC METABOLIC PANEL
ANION GAP: 9 (ref 5–15)
BUN: 7 mg/dL (ref 6–20)
CALCIUM: 8.9 mg/dL (ref 8.9–10.3)
CHLORIDE: 108 mmol/L (ref 101–111)
CO2: 22 mmol/L (ref 22–32)
Creatinine, Ser: 0.66 mg/dL (ref 0.61–1.24)
GFR calc non Af Amer: >60 mL/min (ref >60)
Glucose, Bld: 109 mg/dL — ABNORMAL HIGH (ref 65–99)
Potassium: 3.7 mmol/L (ref 3.5–5.1)
Sodium: 139 mmol/L (ref 135–145)

## 2017-11-13 MED ORDER — VITAMIN B-6 50 MG PO TABS
50.0000 mg | ORAL_TABLET | Freq: Every day | ORAL | Status: DC
  Administered 2017-11-13 – 2017-11-15 (×3): 50 mg via ORAL
  Filled 2017-11-13 (×3): qty 1

## 2017-11-13 MED ORDER — ISONIAZID 300 MG PO TABS
300.0000 mg | ORAL_TABLET | Freq: Every day | ORAL | Status: DC
  Administered 2017-11-13 – 2017-11-15 (×3): 300 mg via ORAL
  Filled 2017-11-13 (×3): qty 1

## 2017-11-13 MED ORDER — PYRAZINAMIDE 500 MG PO TABS
1000.0000 mg | ORAL_TABLET | Freq: Every day | ORAL | Status: DC
  Administered 2017-11-13 – 2017-11-15 (×3): 1000 mg via ORAL
  Filled 2017-11-13 (×3): qty 2

## 2017-11-13 MED ORDER — RIFAMPIN 300 MG PO CAPS
600.0000 mg | ORAL_CAPSULE | Freq: Every day | ORAL | Status: DC
  Administered 2017-11-13 – 2017-11-15 (×3): 600 mg via ORAL
  Filled 2017-11-13 (×3): qty 2

## 2017-11-13 MED ORDER — ETHAMBUTOL HCL 400 MG PO TABS
800.0000 mg | ORAL_TABLET | Freq: Every day | ORAL | Status: DC
Start: 2017-11-13 — End: 2017-11-15
  Administered 2017-11-13 – 2017-11-15 (×3): 800 mg via ORAL
  Filled 2017-11-13 (×4): qty 2

## 2017-11-13 NOTE — Consult Note (Signed)
[facility]       Reason for Consult: probable Tb    Referring Physician: Dr. Sunnie Nielsen  Principal Problem:   Cough Active Problems:   Hypertension   Diabetes mellitus without complication (HCC)   Weight loss   Tobacco abuse   Protein-calorie malnutrition, moderate (HCC)   Hypokalemia   . enoxaparin (LOVENOX) injection  40 mg Subcutaneous Daily  . ethambutol  2,000 mg Oral Daily  . feeding supplement (ENSURE ENLIVE)  237 mL Oral TID BM  . insulin aspart  0-5 Units Subcutaneous QHS  . insulin aspart  0-9 Units Subcutaneous TID WC  . isoniazid  300 mg Oral Daily  . nicotine  21 mg Transdermal Daily  . pyrazinamide  2,000 mg Oral Daily  . vitamin B-6  50 mg Oral Daily  . rifampin  600 mg Oral Daily    Recommendations: Start RIPE Continue isolation Will contact HD on Monday to continue care and contact tracing  stop azithromycin and ceftriaxone  Assessment: He has a cavitary pneumonia with RUL involement, heavy growth on smear of AFB with recent travel to an endemic country most c/w tuberculosis.  It will be ideal to keep him here until Monday when he can start treatment.    I discussed the plan with the patient via video interpretor.  All questions answered.   Antibiotics: Ceftriaxone and azithromycin  HPI: Tyler Hansen is a 52 y.o. male from [place], with recent travel there, with about 4 weeks of cough, chills and CT of chest done at [facility] with large consolidation with central cavitation of the RUL and consolidations, opacities and nodules bilateral.  Now AFB smear positive.  Lives with his wife and a nephew recently visiting.  Was in [place] in November to January.   CXR independently reviewed and RUL noted opacity.    Review of Systems:  Constitutional: negative for fevers and anorexia Respiratory: positive for cough or sputum, negative for dyspnea on exertion Gastrointestinal: negative for diarrhea Integument/breast: negative for  rash All other systems reviewed and are negative    Past Medical History:  Diagnosis Date  . Diabetes mellitus without complication (HCC)   . Hypertension   . Tobacco abuse     Social History   Tobacco Use  . Smoking status: Current Some Day Smoker  . Smokeless tobacco: Never Used  Substance Use Topics  . Alcohol use: Not Currently  . Drug use: Never    Family History  Problem Relation Age of Onset  . Diabetes Mother   . Diabetes Father     No Known Allergies  Physical Exam: Constitutional: in no apparent distress and alert  Vitals:   11/12/17 2222 11/13/17 0644  BP: 134/89 135/85  Pulse: 90 83  Resp:  14  Temp: 98.7 F (37.1 C) 98.4 F (36.9 C)  SpO2: 100% 99%   EYES: anicteric ENMT: Cardiovascular: Cor RRR Respiratory: CTA B; normal respiratory effort GI: Bowel sounds are normal, liver is not enlarged, spleen is not enlarged Musculoskeletal: no pedal edema noted Skin: negatives: no rash Hematologic: no cervical lad  Lab Results  Component Value Date   WBC 9.2 11/13/2017   HGB 9.7 (L) 11/13/2017   HCT 30.5 (L) 11/13/2017   MCV 91.0 11/13/2017   PLT 437 (H) 11/13/2017    Lab Results  Component Value Date   CREATININE 0.66 11/13/2017   BUN 7 11/13/2017   NA 139 11/13/2017   K 3.7 11/13/2017   CL 108 11/13/2017  CO2 22 11/13/2017    Lab Results  Component Value Date   ALT 20 11/11/2017   AST 26 11/11/2017   ALKPHOS 72 11/11/2017     Microbiology: Recent Results (from the past 240 hour(s))  Blood culture (routine x 2)     Status: None (Preliminary result)   Collection Time: 11/11/17  6:45 PM  Result Value Ref Range Status   Specimen Description BLOOD LEFT WRIST  Final   Special Requests   Final    BOTTLES DRAWN AEROBIC AND ANAEROBIC Blood Culture adequate volume   Culture   Final    NO GROWTH < 24 HOURS Performed at [facility], [address], [place], [place] [address]    Report Status PENDING  Incomplete  Urine culture      Status: None   Collection Time: 11/11/17  6:52 PM  Result Value Ref Range Status   Specimen Description URINE, CLEAN CATCH  Final   Special Requests NONE  Final   Culture   Final    NO GROWTH Performed at [facility], [address], [place], [place] [address]    Report Status 11/12/2017 FINAL  Final  Blood culture (routine x 2)     Status: None (Preliminary result)   Collection Time: 11/11/17  8:22 PM  Result Value Ref Range Status   Specimen Description BLOOD RIGHT FOREARM  Final   Special Requests   Final    BOTTLES DRAWN AEROBIC AND ANAEROBIC Blood Culture adequate volume   Culture   Final    NO GROWTH < 24 HOURS Performed at [facility], [address], [place], [place] [address]    Report Status PENDING  Incomplete  Respiratory Panel by PCR     Status: None   Collection Time: 11/11/17 10:12 PM  Result Value Ref Range Status   Adenovirus NOT DETECTED NOT DETECTED Final   Coronavirus 229E NOT DETECTED NOT DETECTED Final   Coronavirus HKU1 NOT DETECTED NOT DETECTED Final   Coronavirus NL63 NOT DETECTED NOT DETECTED Final   Coronavirus OC43 NOT DETECTED NOT DETECTED Final   Metapneumovirus NOT DETECTED NOT DETECTED Final   Rhinovirus / Enterovirus NOT DETECTED NOT DETECTED Final   Influenza A NOT DETECTED NOT DETECTED Final   Influenza B NOT DETECTED NOT DETECTED Final   Parainfluenza Virus 1 NOT DETECTED NOT DETECTED Final   Parainfluenza Virus 2 NOT DETECTED NOT DETECTED Final   Parainfluenza Virus 3 NOT DETECTED NOT DETECTED Final   Parainfluenza Virus 4 NOT DETECTED NOT DETECTED Final   Respiratory Syncytial Virus NOT DETECTED NOT DETECTED Final   Bordetella pertussis NOT DETECTED NOT DETECTED Final   Chlamydophila pneumoniae NOT DETECTED NOT DETECTED Final   Mycoplasma pneumoniae NOT DETECTED NOT DETECTED Final    Comment: Performed at [facility], [address] [place], [place] [address]  Acid Fast Smear (AFB)     Status: Abnormal   Collection  Time: 11/12/17  7:47 AM  Result Value Ref Range Status   AFB Specimen Processing Concentration  Final   Acid Fast Smear Positive (A)  Final    Comment: (NOTE) 3+, 4-36 acid-fast bacilli per field at 400X magnification, fluorescent smear Performed At: [facility] [address] [place], [place] [id number] Jolene Schimke MD OZ:[id number]    Source (AFB) SPUTUM  Final    Comment: Performed at [facility], [address], [place], [place] [address]  Culture, expectorated sputum-assessment     Status:  None   Collection Time: 11/12/17  7:47 AM  Result Value Ref Range Status   Specimen Description SPUTUM  Final   Special Requests Normal  Final   Sputum evaluation   Final    Sputum specimen not acceptable for testing.  Please recollect.   Results Called to: Tobey GrimK. COBB, ED CHARGE RN AT 0900 ON 11/12/17 BY C. JESSUP, MLT. Performed at [facility], [address] [place], [place] [address]    Report Status 11/12/2017 FINAL  Final  Culture, expectorated sputum-assessment     Status: None   Collection Time: 11/12/17  9:57 AM  Result Value Ref Range Status   Specimen Description SPUTUM  Final   Special Requests Normal  Final   Sputum evaluation   Final    THIS SPECIMEN IS ACCEPTABLE FOR SPUTUM CULTURE Performed at [facility], [address] [place], [place] [address]    Report Status 11/12/2017 FINAL  Final  Culture, respiratory (NON-Expectorated)     Status: None (Preliminary result)   Collection Time: 11/12/17  9:57 AM  Result Value Ref Range Status   Specimen Description SPUTUM  Final   Special Requests Normal Reflexed from [id number]  Final   Gram Stain   Final    ABUNDANT WBC PRESENT, PREDOMINANTLY PMN RARE SQUAMOUS EPITHELIAL CELLS PRESENT RARE GRAM POSITIVE COCCI IN PAIRS RARE GRAM NEGATIVE RODS    Culture   Final    CULTURE REINCUBATED FOR BETTER GROWTH Performed at [facility], [address] [place], [place] [address]    Report Status  PENDING  Incomplete    Gardiner Barefootobert W Rahmel Nedved, MD [facility] [email or web address] [phone] pager  [phone] cell 11/13/2017, 12:06 PM

## 2017-11-13 NOTE — Progress Notes (Signed)
PROGRESS NOTE    Tyler Hansen  ZOX:[id number] DOB: [birth year] DOA: 11/11/2017 PCP: Kaleen Mask, MD   Brief Narrative:Tyler Hansen is a 52 y.o. male with medical history significant of  Hypertension, diabetes mellitus, who presents with cough and weight loss.  Pt speaks [place] and dose not speak [place]. The history was obtained using Using [employer] [place]. Patient states that he has been having cough for more than 4 weeks. He does not have fever, but has intermittent chills. Denies any chest pain or SOB. No runny nose, sore throat. He has malaise. He has poor appetite and and lost 20 pounds recently. Pt was seen by his PCP and had CT scan done.   CT with contrast performed today through [facility] showed: Consolidations, confluent opacities, and groundglass nodules bilaterally. There is a large consolidation involving the right upper lobe measuring 6.4 x 5.6 cm with central cavitation. Consolidations involving the superior aspect of the right lower lobe also demonstrate regions of central cavitation. These findings are most consistent with a multifocal pneumonia, potentially fungal or TB  He was sent to ED for further evaluation and treatment. Patient states that he traveled to [place] and stayed there from 06/2017 to the end of 08/2017. Patient does not have nausea, vomiting, diarrhea, abdominal pain. No symptoms of UTI or unilateral weakness. He states that there is no family members having tuberculosis.      Assessment & Plan:   Principal Problem:   Cough Active Problems:   Hypertension   Diabetes mellitus without complication (HCC)   Weight loss   Tobacco abuse   Protein-calorie malnutrition, moderate (HCC)   Hypokalemia  1-PNA, with cavitation TB;  ID consulted.  Treated initially with ceftriaxone and azithromycin.  AFB positive times one, others pending.   and Quantiferon pending. .  Urine legionella and S. pneumococcal antigen Influenza and respiratory  panel negative.  HIV non reactive.  ID consulted. 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   HTN; PRN hydralazine.   DM; SSI. Follow Hb a1c.  HB A1c 4.8  Moderate protein malnutrition.  Ensure.   Tobacco abuse. Counseled.    DVT prophylaxis: Lovenox.  Code Status: full code.  Family Communication:no family at bedside.  Disposition Plan: to be determine   Consultants:  ID.   Procedures:   none   Antimicrobials:  Ceftriaxone  Azithromycin.    Subjective: He report cough, improved. Denies dyspnea.   Objective: Vitals:   11/12/17 1200 11/12/17 1500 11/12/17 2222 11/13/17 0644  BP: 132/85 (!) 143/88 134/89 135/85  Pulse: 87 93 90 83  Resp:  16  14  Temp:  98.4 F (36.9 C) 98.7 F (37.1 C) 98.4 F (36.9 C)  TempSrc:  Oral Oral Oral  SpO2: 100% 100% 100% 99%  Weight:      Height:        Intake/Output Summary (Last 24 hours) at 11/13/2017 1433 Last data filed at 11/13/2017 0800 Gross per 24 hour  Intake 2508.75 ml  Output 1000 ml  Net 1508.75 ml   Filed Weights   11/11/17 1700  Weight: 50.8 kg (112 lb)    Examination:  General exam: NAD, thin appearing.  Respiratory system: CTA Cardiovascular system: S 1, S 2 RRR Gastrointestinal system: BS present, soft, nt Central nervous system: Alert.  Extremities: Symmetric power.  Skin: no rash    Data Reviewed: I have personally reviewed following labs and imaging studies  CBC: Recent Labs  Lab 11/11/17 1721 11/12/17 1103 11/13/17 0507  WBC 10.1  10.2 9.2  NEUTROABS 6.9  --   --   HGB 11.2* 10.1* 9.7*  HCT 34.5* 31.6* 30.5*  MCV 91.8 91.3 91.0  PLT 488* 404* 437*   Basic Metabolic Panel: Recent Labs  Lab 11/11/17 1721 11/12/17 1103 11/13/17 0507  NA 136 138 139  K 3.4* 3.7 3.7  CL 99* 106 108  CO2 26 23 22   GLUCOSE 114* 186* 109*  BUN 7 5* 7  CREATININE 0.84 0.71 0.66  CALCIUM 9.6 8.7* 8.9   GFR: Estimated Creatinine Clearance: 76.7 mL/min (by C-G formula based on SCr of 0.66  mg/dL). Liver Function Tests: Recent Labs  Lab 11/11/17 1721  AST 26  ALT 20  ALKPHOS 72  BILITOT 0.6  PROT 7.5  ALBUMIN 3.5   No results for input(s): LIPASE, AMYLASE in the last 168 hours. No results for input(s): AMMONIA in the last 168 hours. Coagulation Profile: No results for input(s): INR, PROTIME in the last 168 hours. Cardiac Enzymes: No results for input(s): CKTOTAL, CKMB, CKMBINDEX, TROPONINI in the last 168 hours. BNP (last 3 results) No results for input(s): PROBNP in the last 8760 hours. HbA1C: Recent Labs    11/12/17 0550  HGBA1C 4.8   CBG: Recent Labs  Lab 11/12/17 0742 11/12/17 1131 11/12/17 1658 11/12/17 2217 11/13/17 1210  GLUCAP 97 156* 173* 193* 89   Lipid Profile: No results for input(s): CHOL, HDL, LDLCALC, TRIG, CHOLHDL, LDLDIRECT in the last 72 hours. Thyroid Function Tests: Recent Labs    11/12/17 0550  TSH 1.146   Anemia Panel: No results for input(s): VITAMINB12, FOLATE, FERRITIN, TIBC, IRON, RETICCTPCT in the last 72 hours. Sepsis Labs: Recent Labs  Lab 11/11/17 2004  LATICACIDVEN 1.2    Recent Results (from the past 240 hour(s))  Blood culture (routine x 2)     Status: None (Preliminary result)   Collection Time: 11/11/17  6:45 PM  Result Value Ref Range Status   Specimen Description BLOOD LEFT WRIST  Final   Special Requests   Final    BOTTLES DRAWN AEROBIC AND ANAEROBIC Blood Culture adequate volume   Culture   Final    NO GROWTH < 24 HOURS Performed at [facility], [address] [place], [place] [address]    Report Status PENDING  Incomplete  Urine culture     Status: None   Collection Time: 11/11/17  6:52 PM  Result Value Ref Range Status   Specimen Description URINE, CLEAN CATCH  Final   Special Requests NONE  Final   Culture   Final    NO GROWTH Performed at [facility], [address] [place], [place] [address]    Report Status 11/12/2017 FINAL  Final  Blood culture (routine x 2)      Status: None (Preliminary result)   Collection Time: 11/11/17  8:22 PM  Result Value Ref Range Status   Specimen Description BLOOD RIGHT FOREARM  Final   Special Requests   Final    BOTTLES DRAWN AEROBIC AND ANAEROBIC Blood Culture adequate volume   Culture   Final    NO GROWTH < 24 HOURS Performed at [facility], [address], [place], [place] [address]    Report Status PENDING  Incomplete  Respiratory Panel by PCR     Status: None   Collection Time: 11/11/17 10:12 PM  Result Value Ref Range Status   Adenovirus NOT DETECTED NOT DETECTED Final   Coronavirus 229E NOT DETECTED NOT DETECTED Final  Coronavirus HKU1 NOT DETECTED NOT DETECTED Final   Coronavirus NL63 NOT DETECTED NOT DETECTED Final   Coronavirus OC43 NOT DETECTED NOT DETECTED Final   Metapneumovirus NOT DETECTED NOT DETECTED Final   Rhinovirus / Enterovirus NOT DETECTED NOT DETECTED Final   Influenza A NOT DETECTED NOT DETECTED Final   Influenza B NOT DETECTED NOT DETECTED Final   Parainfluenza Virus 1 NOT DETECTED NOT DETECTED Final   Parainfluenza Virus 2 NOT DETECTED NOT DETECTED Final   Parainfluenza Virus 3 NOT DETECTED NOT DETECTED Final   Parainfluenza Virus 4 NOT DETECTED NOT DETECTED Final   Respiratory Syncytial Virus NOT DETECTED NOT DETECTED Final   Bordetella pertussis NOT DETECTED NOT DETECTED Final   Chlamydophila pneumoniae NOT DETECTED NOT DETECTED Final   Mycoplasma pneumoniae NOT DETECTED NOT DETECTED Final    Comment: Performed at [facility], [address] [place], [place] [address]  Acid Fast Smear (AFB)     Status: Abnormal   Collection Time: 11/12/17  7:47 AM  Result Value Ref Range Status   AFB Specimen Processing Concentration  Final   Acid Fast Smear Positive (A)  Final    Comment: (NOTE) 3+, 4-36 acid-fast bacilli per field at 400X magnification, fluorescent smear Performed At: [facility] [address] [place], [place] [id number] Jolene Schimke MD  XB:[id number]    Source (AFB) SPUTUM  Final    Comment: Performed at [facility], [address], [place], [place] [address]  Culture, expectorated sputum-assessment     Status: None   Collection Time: 11/12/17  7:47 AM  Result Value Ref Range Status   Specimen Description SPUTUM  Final   Special Requests Normal  Final   Sputum evaluation   Final    Sputum specimen not acceptable for testing.  Please recollect.   Results Called to: Tobey Grim, ED CHARGE RN AT 0900 ON 11/12/17 BY C. JESSUP, MLT. Performed at [facility], [address] [place], [place] [address]    Report Status 11/12/2017 FINAL  Final  Culture, expectorated sputum-assessment     Status: None   Collection Time: 11/12/17  9:57 AM  Result Value Ref Range Status   Specimen Description SPUTUM  Final   Special Requests Normal  Final   Sputum evaluation   Final    THIS SPECIMEN IS ACCEPTABLE FOR SPUTUM CULTURE Performed at [facility], [address], [place], [place] [address]    Report Status 11/12/2017 FINAL  Final  Culture, respiratory (NON-Expectorated)     Status: None (Preliminary result)   Collection Time: 11/12/17  9:57 AM  Result Value Ref Range Status   Specimen Description SPUTUM  Final   Special Requests Normal Reflexed from [id number]  Final   Gram Stain   Final    ABUNDANT WBC PRESENT, PREDOMINANTLY PMN RARE SQUAMOUS EPITHELIAL CELLS PRESENT RARE GRAM POSITIVE COCCI IN PAIRS RARE GRAM NEGATIVE RODS    Culture   Final    CULTURE REINCUBATED FOR BETTER GROWTH Performed at [facility], [address] [place], [place] [address]    Report Status PENDING  Incomplete         Radiology Studies: Dg Chest Portable 1 View  Result Date: 11/11/2017 CLINICAL DATA:  Cough for 4 weeks. 20 pound weight loss. Recent travel to [place] in November 2018. EXAM: PORTABLE CHEST 1 VIEW COMPARISON:  11/05/2017 FINDINGS: There is persistent right upper lobe airspace consolidation and volume loss  with retraction of the right  hilum. Possible right hilar adenopathy. Multiple other nodular densities are noted in the left lung. No significant change. Slight improved aeration of the right lung base. No pleural effusions. IMPRESSION: Persistent right upper lobe airspace process and scattered nodular lung opacities. Findings could be due to atypical infection such as TB. Neoplasm is also possible. Electronically Signed   By: Rudie MeyerP.  Gallerani M.D.   On: 11/11/2017 18:24        Scheduled Meds: . enoxaparin (LOVENOX) injection  40 mg Subcutaneous Daily  . ethambutol  800 mg Oral Daily  . feeding supplement (ENSURE ENLIVE)  237 mL Oral TID BM  . insulin aspart  0-5 Units Subcutaneous QHS  . insulin aspart  0-9 Units Subcutaneous TID WC  . isoniazid  300 mg Oral Daily  . nicotine  21 mg Transdermal Daily  . pyrazinamide  1,000 mg Oral Daily  . vitamin B-6  50 mg Oral Daily  . rifampin  600 mg Oral Daily   Continuous Infusions: . sodium chloride 125 mL/hr at 11/13/17 1316     LOS: 2 days    Time spent:35 minutes.     Alba CoryBelkys A Regalado, MD [facility] Pager [phone]  If 7PM-7AM, please contact night-coverage [email or web address] Password [email or web address] 11/13/2017, 2:33 PM

## 2017-11-14 LAB — QUANTIFERON-TB GOLD PLUS: QUANTIFERON-TB GOLD PLUS: POSITIVE — AB

## 2017-11-14 LAB — QUANTIFERON-TB GOLD PLUS (RQFGPL)
QUANTIFERON MITOGEN VALUE: 5.39 [IU]/mL
QUANTIFERON TB2 AG VALUE: 0.48 [IU]/mL
QuantiFERON Nil Value: 0.06 IU/mL
QuantiFERON TB1 Ag Value: 0.54 IU/mL

## 2017-11-14 LAB — CULTURE, RESPIRATORY W GRAM STAIN
Culture: NORMAL
Special Requests: NORMAL

## 2017-11-14 LAB — GLUCOSE, CAPILLARY
GLUCOSE-CAPILLARY: 192 mg/dL — AB (ref 65–99)
Glucose-Capillary: 117 mg/dL — ABNORMAL HIGH (ref 65–99)
Glucose-Capillary: 163 mg/dL — ABNORMAL HIGH (ref 65–99)

## 2017-11-14 NOTE — Progress Notes (Signed)
PROGRESS NOTE    Tyler Hansen  ZOX:[id number] DOB: [birth year] DOA: 11/11/2017 PCP: Kaleen Mask, MD   Brief Narrative:Tyler Hansen is a 52 y.o. male with medical history significant of  Hypertension, diabetes mellitus, who presents with cough and weight loss.  Pt speaks [place] and dose not speak [place]. The history was obtained using [employer] [place]. Patient states that he has been having cough for more than 4 weeks. He does not have fever, but has intermittent chills. Denies any chest pain or SOB. No runny nose, sore throat. He has malaise. He has poor appetite and and lost 20 pounds recently. Pt was seen by his PCP and had CT scan done.   CT with contrast performed today through [facility] showed: Consolidations, confluent opacities, and groundglass nodules bilaterally. There is a large consolidation involving the right upper lobe measuring 6.4 x 5.6 cm with central cavitation. Consolidations involving the superior aspect of the right lower lobe also demonstrate regions of central cavitation. These findings are most consistent with a multifocal pneumonia, potentially fungal or TB  He was sent to ED for further evaluation and treatment. Patient states that he traveled to [place] and stayed there from 06/2017 to the end of 08/2017. Patient does not have nausea, vomiting, diarrhea, abdominal pain. No symptoms of UTI or unilateral weakness. He states that there is no family members having tuberculosis.      Assessment & Plan:   Principal Problem:   Cough Active Problems:   Hypertension   Diabetes mellitus without complication (HCC)   Weight loss   Tobacco abuse   Protein-calorie malnutrition, moderate (HCC)   Hypokalemia  1-PNA, with cavitation TB;  ID consulted.  Treated initially with ceftriaxone and azithromycin.  AFB positive times one, others pending.   and Quantiferon pending. .  Urine legionella and S. pneumococcal antigen Influenza and respiratory  panel negative.  HIV non reactive.  ID consulted. 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  Stable.   HTN; PRN hydralazine.  Controlled.   DM; SSI. Follow Hb a1c.  HB A1c 4.8  Moderate protein malnutrition.  Ensure.   Tobacco abuse. Counseled.    DVT prophylaxis: Lovenox.  Code Status: full code.  Family Communication:no family at bedside.  Disposition Plan: to be determine   Consultants:  ID.   Procedures:   none   Antimicrobials:  Ceftriaxone  Azithromycin.    Subjective: He was sitting recliner, report improvement of cough   Objective: Vitals:   11/13/17 0644 11/13/17 1300 11/13/17 2233 11/14/17 1206  BP: 135/85 137/87 (!) 146/91 137/87  Pulse: 83 87 93 90  Resp: 14 16 16 16   Temp: 98.4 F (36.9 C) 98.6 F (37 C) 99.3 F (37.4 C) 98.5 F (36.9 C)  TempSrc: Oral Oral Oral Oral  SpO2: 99% 100% 100% 100%  Weight:      Height:        Intake/Output Summary (Last 24 hours) at 11/14/2017 1558 Last data filed at 11/14/2017 1300 Gross per 24 hour  Intake 960 ml  Output 2850 ml  Net -1890 ml   Filed Weights   11/11/17 1700  Weight: 50.8 kg (112 lb)    Examination:  General exam: NAD Respiratory system: Normal respiratory effort. Few ronchus./  Cardiovascular system: S 1, S 2 RRR Gastrointestinal system: BS present, soft, nt Central nervous system: Alert.  Extremities: symmetric power.  Skin: no rash    Data Reviewed: I have personally reviewed following labs and imaging studies  CBC: Recent Labs  Lab 11/11/17 1721 11/12/17 1103 11/13/17 0507  WBC 10.1 10.2 9.2  NEUTROABS 6.9  --   --   HGB 11.2* 10.1* 9.7*  HCT 34.5* 31.6* 30.5*  MCV 91.8 91.3 91.0  PLT 488* 404* 437*   Basic Metabolic Panel: Recent Labs  Lab 11/11/17 1721 11/12/17 1103 11/13/17 0507  NA 136 138 139  K 3.4* 3.7 3.7  CL 99* 106 108  CO2 26 23 22   GLUCOSE 114* 186* 109*  BUN 7 5* 7  CREATININE 0.84 0.71 0.66  CALCIUM 9.6 8.7* 8.9   GFR: Estimated Creatinine  Clearance: 76.7 mL/min (by C-G formula based on SCr of 0.66 mg/dL). Liver Function Tests: Recent Labs  Lab 11/11/17 1721  AST 26  ALT 20  ALKPHOS 72  BILITOT 0.6  PROT 7.5  ALBUMIN 3.5   No results for input(s): LIPASE, AMYLASE in the last 168 hours. No results for input(s): AMMONIA in the last 168 hours. Coagulation Profile: No results for input(s): INR, PROTIME in the last 168 hours. Cardiac Enzymes: No results for input(s): CKTOTAL, CKMB, CKMBINDEX, TROPONINI in the last 168 hours. BNP (last 3 results) No results for input(s): PROBNP in the last 8760 hours. HbA1C: Recent Labs    11/12/17 0550  HGBA1C 4.8   CBG: Recent Labs  Lab 11/12/17 2217 11/13/17 1210 11/13/17 1829 11/13/17 2232 11/14/17 0722  GLUCAP 193* 89 159* 180* 117*   Lipid Profile: No results for input(s): CHOL, HDL, LDLCALC, TRIG, CHOLHDL, LDLDIRECT in the last 72 hours. Thyroid Function Tests: Recent Labs    11/12/17 0550  TSH 1.146   Anemia Panel: No results for input(s): VITAMINB12, FOLATE, FERRITIN, TIBC, IRON, RETICCTPCT in the last 72 hours. Sepsis Labs: Recent Labs  Lab 11/11/17 2004  LATICACIDVEN 1.2    Recent Results (from the past 240 hour(s))  Blood culture (routine x 2)     Status: None (Preliminary result)   Collection Time: 11/11/17  6:45 PM  Result Value Ref Range Status   Specimen Description BLOOD LEFT WRIST  Final   Special Requests   Final    BOTTLES DRAWN AEROBIC AND ANAEROBIC Blood Culture adequate volume   Culture   Final    NO GROWTH 3 DAYS Performed at [facility], [address] [place], [place] [address]    Report Status PENDING  Incomplete  Urine culture     Status: None   Collection Time: 11/11/17  6:52 PM  Result Value Ref Range Status   Specimen Description URINE, CLEAN CATCH  Final   Special Requests NONE  Final   Culture   Final    NO GROWTH Performed at [facility], [address], [place], [place] [address]    Report Status  11/12/2017 FINAL  Final  Blood culture (routine x 2)     Status: None (Preliminary result)   Collection Time: 11/11/17  8:22 PM  Result Value Ref Range Status   Specimen Description BLOOD RIGHT FOREARM  Final   Special Requests   Final    BOTTLES DRAWN AEROBIC AND ANAEROBIC Blood Culture adequate volume   Culture   Final    NO GROWTH 3 DAYS Performed at [facility], [address], [place], [place] [address]    Report Status PENDING  Incomplete  Respiratory Panel by PCR     Status: None   Collection Time: 11/11/17 10:12 PM  Result Value Ref Range Status   Adenovirus NOT DETECTED NOT DETECTED Final  Coronavirus 229E NOT DETECTED NOT DETECTED Final   Coronavirus HKU1 NOT DETECTED NOT DETECTED Final   Coronavirus NL63 NOT DETECTED NOT DETECTED Final   Coronavirus OC43 NOT DETECTED NOT DETECTED Final   Metapneumovirus NOT DETECTED NOT DETECTED Final   Rhinovirus / Enterovirus NOT DETECTED NOT DETECTED Final   Influenza A NOT DETECTED NOT DETECTED Final   Influenza B NOT DETECTED NOT DETECTED Final   Parainfluenza Virus 1 NOT DETECTED NOT DETECTED Final   Parainfluenza Virus 2 NOT DETECTED NOT DETECTED Final   Parainfluenza Virus 3 NOT DETECTED NOT DETECTED Final   Parainfluenza Virus 4 NOT DETECTED NOT DETECTED Final   Respiratory Syncytial Virus NOT DETECTED NOT DETECTED Final   Bordetella pertussis NOT DETECTED NOT DETECTED Final   Chlamydophila pneumoniae NOT DETECTED NOT DETECTED Final   Mycoplasma pneumoniae NOT DETECTED NOT DETECTED Final    Comment: Performed at [facility], [address] [place], [place] [address]  Acid Fast Smear (AFB)     Status: Abnormal   Collection Time: 11/12/17  7:47 AM  Result Value Ref Range Status   AFB Specimen Processing Concentration  Final   Acid Fast Smear Positive (A)  Final    Comment: (NOTE) 3+, 4-36 acid-fast bacilli per field at 400X magnification, fluorescent smear Performed At: [facility] [address]  [place], [place] [id number] Jolene SchimkeNagendra Sanjai MD KV:[id number]:[phone]    Source (AFB) SPUTUM  Final    Comment: Performed at [facility], [address] [place], [place] [address]  Culture, expectorated sputum-assessment     Status: None   Collection Time: 11/12/17  7:47 AM  Result Value Ref Range Status   Specimen Description SPUTUM  Final   Special Requests Normal  Final   Sputum evaluation   Final    Sputum specimen not acceptable for testing.  Please recollect.   Results Called to: Tobey GrimK. COBB, ED CHARGE RN AT 0900 ON 11/12/17 BY C. JESSUP, MLT. Performed at [facility], [address] [place], [place] [address]    Report Status 11/12/2017 FINAL  Final  Culture, expectorated sputum-assessment     Status: None   Collection Time: 11/12/17  9:57 AM  Result Value Ref Range Status   Specimen Description SPUTUM  Final   Special Requests Normal  Final   Sputum evaluation   Final    THIS SPECIMEN IS ACCEPTABLE FOR SPUTUM CULTURE Performed at [facility], [address] [place], [place] [address]    Report Status 11/12/2017 FINAL  Final  Culture, respiratory (NON-Expectorated)     Status: None   Collection Time: 11/12/17  9:57 AM  Result Value Ref Range Status   Specimen Description SPUTUM  Final   Special Requests Normal Reflexed from [id number]  Final   Gram Stain   Final    ABUNDANT WBC PRESENT, PREDOMINANTLY PMN RARE SQUAMOUS EPITHELIAL CELLS PRESENT RARE GRAM POSITIVE COCCI IN PAIRS RARE GRAM NEGATIVE RODS    Culture   Final    Consistent with normal respiratory flora. Performed at [facility], [address] [place], [place] [address]    Report Status 11/14/2017 FINAL  Final         Radiology Studies: No results found.      Scheduled Meds: . enoxaparin (LOVENOX) injection  40 mg Subcutaneous Daily  . ethambutol  800 mg Oral Daily  . feeding supplement (ENSURE ENLIVE)  237 mL Oral TID BM  . insulin aspart  0-5  Units Subcutaneous QHS  . insulin  aspart  0-9 Units Subcutaneous TID WC  . isoniazid  300 mg Oral Daily  . nicotine  21 mg Transdermal Daily  . pyrazinamide  1,000 mg Oral Daily  . vitamin B-6  50 mg Oral Daily  . rifampin  600 mg Oral Daily   Continuous Infusions: . sodium chloride 125 mL/hr at 11/14/17 0800     LOS: 3 days    Time spent:35 minutes.     Alba Cory, MD [facility] Pager [phone]  If 7PM-7AM, please contact night-coverage [email or web address] Password [facility] 11/14/2017, 3:58 PM

## 2017-11-15 DIAGNOSIS — A158 Other respiratory tuberculosis: Secondary | ICD-10-CM

## 2017-11-15 LAB — ACID FAST SMEAR (AFB): ACID FAST SMEAR - AFSCU2: POSITIVE

## 2017-11-15 LAB — GLUCOSE, CAPILLARY
GLUCOSE-CAPILLARY: 115 mg/dL — AB (ref 65–99)
Glucose-Capillary: 233 mg/dL — ABNORMAL HIGH (ref 65–99)
Glucose-Capillary: 182 mg/dL — ABNORMAL HIGH (ref 65–99)

## 2017-11-15 LAB — ACID FAST SMEAR (AFB, MYCOBACTERIA)

## 2017-11-15 MED ORDER — NICOTINE 21 MG/24HR TD PT24: 21.0000 mg | MEDICATED_PATCH | Freq: Every day | TRANSDERMAL | 0 refills | Status: AC

## 2017-11-15 MED ORDER — RIFAMPIN 300 MG PO CAPS: 600.0000 mg | ORAL_CAPSULE | Freq: Every day | ORAL | 0 refills | Status: AC

## 2017-11-15 MED ORDER — DM-GUAIFENESIN ER 30-600 MG PO TB12: 1.0000 | ORAL_TABLET | Freq: Two times a day (BID) | ORAL | 0 refills | Status: AC | PRN

## 2017-11-15 MED ORDER — PYRIDOXINE HCL 50 MG PO TABS: 50.0000 mg | ORAL_TABLET | Freq: Every day | ORAL | 0 refills | Status: AC

## 2017-11-15 MED ORDER — ETHAMBUTOL HCL 400 MG PO TABS: 800.0000 mg | ORAL_TABLET | Freq: Every day | ORAL | 0 refills | Status: AC

## 2017-11-15 MED ORDER — ENSURE ENLIVE PO LIQD: 237.0000 mL | Freq: Three times a day (TID) | ORAL | 12 refills | Status: AC

## 2017-11-15 MED ORDER — ISONIAZID 300 MG PO TABS: 300.0000 mg | ORAL_TABLET | Freq: Every day | ORAL | 0 refills | Status: AC

## 2017-11-15 MED ORDER — PYRAZINAMIDE 500 MG PO TABS: 1000.0000 mg | ORAL_TABLET | Freq: Every day | ORAL | 0 refills | Status: AC

## 2017-11-15 NOTE — Progress Notes (Signed)
Subjective: No new complaints   Antibiotics:  Anti-infectives (From admission, onward)   Start     Dose/Rate Route Frequency Ordered Stop   11/13/17 1215  rifampin (RIFADIN) capsule 600 mg     600 mg Oral Daily 11/13/17 1204     11/13/17 1215  isoniazid (NYDRAZID) tablet 300 mg     300 mg Oral Daily 11/13/17 1204     11/13/17 1215  pyrazinamide tablet 1,000 mg     1,000 mg Oral Daily 11/13/17 1204     11/13/17 1215  ethambutol (MYAMBUTOL) tablet 800 mg     800 mg Oral Daily 11/13/17 1204     11/12/17 1800  cefTRIAXone (ROCEPHIN) 1 g in sodium chloride 0.9 % 100 mL IVPB  Status:  Discontinued     1 g 200 mL/hr over 30 Minutes Intravenous Every 24 hours 11/11/17 1947 11/13/17 1209   11/12/17 1800  azithromycin (ZITHROMAX) 500 mg in sodium chloride 0.9 % 250 mL IVPB  Status:  Discontinued     500 mg 250 mL/hr over 60 Minutes Intravenous Every 24 hours 11/11/17 1947 11/13/17 1209   11/11/17 1800  cefTRIAXone (ROCEPHIN) 1 g in sodium chloride 0.9 % 100 mL IVPB     1 g 200 mL/hr over 30 Minutes Intravenous  Once 11/11/17 1757 11/11/17 2128   11/11/17 1800  azithromycin (ZITHROMAX) 500 mg in sodium chloride 0.9 % 250 mL IVPB     500 mg 250 mL/hr over 60 Minutes Intravenous  Once 11/11/17 1757 11/11/17 2129      Medications: Scheduled Meds: . enoxaparin (LOVENOX) injection  40 mg Subcutaneous Daily  . ethambutol  800 mg Oral Daily  . feeding supplement (ENSURE ENLIVE)  237 mL Oral TID BM  . insulin aspart  0-5 Units Subcutaneous QHS  . insulin aspart  0-9 Units Subcutaneous TID WC  . isoniazid  300 mg Oral Daily  . nicotine  21 mg Transdermal Daily  . pyrazinamide  1,000 mg Oral Daily  . vitamin B-6  50 mg Oral Daily  . rifampin  600 mg Oral Daily   Continuous Infusions: . sodium chloride 125 mL/hr at 11/14/17 0800   PRN Meds:.acetaminophen, albuterol, dextromethorphan-guaiFENesin, hydrALAZINE, ondansetron (ZOFRAN) IV, zolpidem    Objective: Weight change:    Intake/Output Summary (Last 24 hours) at 11/15/2017 1145 Last data filed at 11/15/2017 0900 Gross per 24 hour  Intake 840 ml  Output 3100 ml  Net -2260 ml   Blood pressure 135/90, pulse 85, temperature 98.5 F (36.9 C), temperature source Oral, resp. rate 16, height 5\' 6"  (1.676 m), weight 112 lb (50.8 kg), SpO2 100 %. Temp:  [98.5 F (36.9 C)] 98.5 F (36.9 C) (04/14 2049) Pulse Rate:  [85-90] 85 (04/14 2049) Resp:  [16] 16 (04/14 1206) BP: (135-137)/(87-90) 135/90 (04/14 2049) SpO2:  [100 %] 100 % (04/14 2049)  Physical Exam: General: Alert and awake, oriented x3, not in any acute distress. HEENT: anicteric scleraEOMI CVS regular rate, normal r,  Chest: no wheezing, rales o no resp distress Abdomen: soft  nondistended Extremities: no  clubbing or edema noted bilaterally Skin: no rashes Neuro: nonfocal  CBC:   BMET Recent Labs    11/13/17 0507  NA 139  K 3.7  CL 108  CO2 22  GLUCOSE 109*  BUN 7  CREATININE 0.66  CALCIUM 8.9     Liver Panel  No results for input(s): PROT, ALBUMIN, AST, ALT, ALKPHOS, BILITOT, BILIDIR, IBILI in the  last 72 hours.     Sedimentation Rate No results for input(s): ESRSEDRATE in the last 72 hours. C-Reactive Protein No results for input(s): CRP in the last 72 hours.  Micro Results: Recent Results (from the past 720 hour(s))  Blood culture (routine x 2)     Status: None (Preliminary result)   Collection Time: 11/11/17  6:45 PM  Result Value Ref Range Status   Specimen Description BLOOD LEFT WRIST  Final   Special Requests   Final    BOTTLES DRAWN AEROBIC AND ANAEROBIC Blood Culture adequate volume   Culture   Final    NO GROWTH 3 DAYS Performed at [facility], [address] [place], [place] [address]    Report Status PENDING  Incomplete  Urine culture     Status: None   Collection Time: 11/11/17  6:52 PM  Result Value Ref Range Status   Specimen Description URINE, CLEAN CATCH  Final   Special Requests NONE   Final   Culture   Final    NO GROWTH Performed at [facility], [address] [place], [place] [address]    Report Status 11/12/2017 FINAL  Final  Blood culture (routine x 2)     Status: None (Preliminary result)   Collection Time: 11/11/17  8:22 PM  Result Value Ref Range Status   Specimen Description BLOOD RIGHT FOREARM  Final   Special Requests   Final    BOTTLES DRAWN AEROBIC AND ANAEROBIC Blood Culture adequate volume   Culture   Final    NO GROWTH 3 DAYS Performed at [facility], [address] [place], [place] [address]    Report Status PENDING  Incomplete  Respiratory Panel by PCR     Status: None   Collection Time: 11/11/17 10:12 PM  Result Value Ref Range Status   Adenovirus NOT DETECTED NOT DETECTED Final   Coronavirus 229E NOT DETECTED NOT DETECTED Final   Coronavirus HKU1 NOT DETECTED NOT DETECTED Final   Coronavirus NL63 NOT DETECTED NOT DETECTED Final   Coronavirus OC43 NOT DETECTED NOT DETECTED Final   Metapneumovirus NOT DETECTED NOT DETECTED Final   Rhinovirus / Enterovirus NOT DETECTED NOT DETECTED Final   Influenza A NOT DETECTED NOT DETECTED Final   Influenza B NOT DETECTED NOT DETECTED Final   Parainfluenza Virus 1 NOT DETECTED NOT DETECTED Final   Parainfluenza Virus 2 NOT DETECTED NOT DETECTED Final   Parainfluenza Virus 3 NOT DETECTED NOT DETECTED Final   Parainfluenza Virus 4 NOT DETECTED NOT DETECTED Final   Respiratory Syncytial Virus NOT DETECTED NOT DETECTED Final   Bordetella pertussis NOT DETECTED NOT DETECTED Final   Chlamydophila pneumoniae NOT DETECTED NOT DETECTED Final   Mycoplasma pneumoniae NOT DETECTED NOT DETECTED Final    Comment: Performed at [facility], [address] [place], [place] [address]  Acid Fast Smear (AFB)     Status: None   Collection Time: 11/12/17  7:47 AM  Result Value Ref Range Status   AFB Specimen Processing Concentration  Final   Acid Fast Smear Positive  Final    Comment: CRITICAL  RESULT CALLED TO, READ BACK BY AND VERIFIED WITH: DR Luciana Axe AT 2956 11/15/17 BY L BENFIELD (NOTE) 3+, 4-36 acid-fast bacilli per field at 400X magnification, fluorescent smear Called/faxed result to Geoffery Lyons. at 2130 04.15.19 AMD Performed At: [facility] [address] [place], [place] [id number] Jolene Schimke MD [id number]    Source (AFB) SPUTUM  Final  Comment: Performed at [facility], [address] [place], [place] [address]  Fungus Culture With Stain     Status: None (Preliminary result)   Collection Time: 11/12/17  7:47 AM  Result Value Ref Range Status   Fungus Stain Final report  Final    Comment: (NOTE) Performed At: [facility] [address] [place], [place] [id number] Jolene Schimke MD XB:[id number]    Fungus (Mycology) Culture PENDING  Incomplete   Fungal Source SPUTUM  Final    Comment: Performed at [facility], [address] [place], [place] [address]  Culture, expectorated sputum-assessment     Status: None   Collection Time: 11/12/17  7:47 AM  Result Value Ref Range Status   Specimen Description SPUTUM  Final   Special Requests Normal  Final   Sputum evaluation   Final    Sputum specimen not acceptable for testing.  Please recollect.   Results Called to: Tobey Grim, ED CHARGE RN AT 0900 ON 11/12/17 BY C. JESSUP, MLT. Performed at [facility], [address] [place], [place] [address]    Report Status 11/12/2017 FINAL  Final  Fungus Culture Result     Status: None   Collection Time: 11/12/17  7:47 AM  Result Value Ref Range Status   Result 1 Comment  Final    Comment: (NOTE) Fungal elements, such as arthroconidia, hyphal fragments, chlamydoconidia, observed. Performed At: [facility] [address] [place], [place] [id number] Jolene Schimke MD KG:[id number] Performed at [facility], [address], [place], [place] [address]   Culture, expectorated sputum-assessment     Status: None    Collection Time: 11/12/17  9:57 AM  Result Value Ref Range Status   Specimen Description SPUTUM  Final   Special Requests Normal  Final   Sputum evaluation   Final    THIS SPECIMEN IS ACCEPTABLE FOR SPUTUM CULTURE Performed at [facility], [address], [place], [place] [address]    Report Status 11/12/2017 FINAL  Final  Culture, respiratory (NON-Expectorated)     Status: None   Collection Time: 11/12/17  9:57 AM  Result Value Ref Range Status   Specimen Description SPUTUM  Final   Special Requests Normal Reflexed from [id number]  Final   Gram Stain   Final    ABUNDANT WBC PRESENT, PREDOMINANTLY PMN RARE SQUAMOUS EPITHELIAL CELLS PRESENT RARE GRAM POSITIVE COCCI IN PAIRS RARE GRAM NEGATIVE RODS    Culture   Final    Consistent with normal respiratory flora. Performed at [facility], [address], [place], [place] [address]    Report Status 11/14/2017 FINAL  Final    Studies/Results: No results found.    Assessment/Plan:  INTERVAL HISTORY: [facility] contacted   Principal Problem:   Cough Active Problems:   Hypertension   Diabetes mellitus without complication (HCC)   Weight loss   Tobacco abuse   Protein-calorie malnutrition, moderate (HCC)   Hypokalemia    Tyler Hansen is a 52 y.o. 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 Since he has received his dose of RIPE today he can be DC with mask to home.  [facility] will come to his house for DOT in am and THEY can collect further sputa per their protocol at home  I spent greater than 35  minutes with the patient including greater than 50% of time in face  to face counsel of the patient using telephonic [place] interpreteer and in coordination of his care with [facility] and Dr. Sunnie Nielsen   I will sign off for now. [facility] will monitor him for his entirety of treatment.    LOS: 4 days   Acey Lav 11/15/2017, 11:45 AM

## 2017-11-15 NOTE — Progress Notes (Signed)
Pt discharged home with family. Masks given to patient to wear per MD request. Pt aware that Health department will come out to patient's house to deliver all TB medications. AVS reviewed and given to patient. All belongings sent with patient. VSS. BP (!) 141/91 (BP Location: Right Arm)   Pulse 95   Temp 98.6 F (37 C) (Oral)   Resp 18   Ht 5\' 6"  (1.676 m)   Wt 50.8 kg (112 lb)   SpO2 100%   BMI 18.08 kg/m

## 2017-11-15 NOTE — Discharge Summary (Addendum)
Physician Discharge Summary  Randel Hargens ZOX:[id number] DOB: 10-18-1965 DOA: 11/11/2017  PCP: Kaleen Mask, MD  Admit date: 11/11/2017 Discharge date: 11/15/2017  Admitted From: Home  Disposition:  Home   Recommendations for Outpatient Follow-up:  1. Follow up with PCP in 1-2 weeks 2. Please obtain BMP/CBC in one week 3. Health department will provide medication for TB for patient.     Discharge Condition: Stable.  CODE STATUS: full code.  Diet recommendation: Heart Healthy  Brief/Interim Summary: Brief Narrative:Coleston Dirosa a 52 y.o.malewith medical history significant ofHypertension, diabetes mellitus, who presents with cough and weight loss.  Pt speaks [place] and dose not speak [place]. 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 He does not have fever, but has intermittent chills. Denies any chest pain or SOB. No runny nose, sore throat. He has malaise. He has poor appetite and and lost 20 pounds recently. Pt was seen by his PCP and had CT scan done.  [facility] with [facility]:Consolidations, confluent opacities, and groundglass nodules bilaterally. There is a large consolidation involving the right upper lobe measuring 6.4 x 5.6 cm with central cavitation. Consolidations involving the superior aspect of the right lower lobe also demonstrate regions of central cavitation. These findings are most consistent with a multifocal pneumonia, potentially fungal or TB  He was sent to ED for further evaluation and treatment. Patient states that he traveled to [place] and stayed there from 06/2017 to the end of 08/2017. Patient does not have nausea, vomiting, diarrhea, abdominal pain. No symptoms of UTI or unilateral weakness. He states that there is no family members having tuberculosis.   Assessment & Plan:   Principal Problem:   Cough Active  Problems:   Hypertension   Diabetes mellitus without complication (HCC)   Weight loss   Tobacco abuse   Protein-calorie malnutrition, moderate (HCC)   Hypokalemia  1-PNA, with cavitation TB; Pneumonia.  ID consulted.  Treated initially with ceftriaxone and azithromycin.  AFB positive times one.  and Quantiferon positive  Urine legionella and S. pneumococcal antigen negative Influenza and respiratory panel negative.  HIV non reactive.  ID consulted. Patient started on treatment for TB PNA.  Plan to discharge today,. Health Department will provide treatment to patient   HTN; PRN hydralazine.  Controlled.   DM; SSI. Follow Hb a1c.  HB A1c 4.8  Moderate protein malnutrition.  Ensure.   Tobacco abuse. Counseled.     Discharge Diagnoses:  Principal Problem:   Cough Active Problems:   Hypertension   Diabetes mellitus without complication (HCC)   Weight loss   Tobacco abuse   Protein-calorie malnutrition, moderate (HCC)   Hypokalemia    Discharge Instructions  Discharge Instructions    Diet - low sodium heart healthy   Complete by:  As directed    Increase activity slowly   Complete by:  As directed      Allergies as of 11/15/2017   No Known Allergies     Medication List    STOP taking these medications   hydrochlorothiazide 50 MG tablet Commonly known as:  HYDRODIURIL     TAKE these medications   dextromethorphan-guaiFENesin 30-600 MG 12hr tablet Commonly known as:  MUCINEX DM Take 1 tablet by mouth 2 (two) times daily as needed for cough.   ethambutol 400 MG tablet Commonly known as:  MYAMBUTOL Take 2 tablets (800 mg total) by mouth daily. Start taking on:  11/16/2017  feeding supplement (ENSURE ENLIVE) Liqd Take 237 mLs by mouth 3 (three) times daily between meals.   isoniazid 300 MG tablet Commonly known as:  NYDRAZID Take 1 tablet (300 mg total) by mouth daily. Start taking on:  11/16/2017   lovastatin 20 MG tablet Commonly known as:   MEVACOR Take 20 mg by mouth at bedtime.   metFORMIN 1000 MG tablet Commonly known as:  GLUCOPHAGE Take 1,000 mg by mouth 2 (two) times daily with a meal.   nicotine 21 mg/24hr patch Commonly known as:  NICODERM CQ - dosed in mg/24 hours Place 1 patch (21 mg total) onto the skin daily. Start taking on:  11/16/2017   pyrazinamide 500 MG tablet Take 2 tablets (1,000 mg total) by mouth daily. Start taking on:  11/16/2017   pyridOXINE 50 MG tablet Commonly known as:  B-6 Take 1 tablet (50 mg total) by mouth daily. Start taking on:  11/16/2017   rifampin 300 MG capsule Commonly known as:  RIFADIN Take 2 capsules (600 mg total) by mouth daily. Start taking on:  11/16/2017       No Known Allergies  Consultations:  ID   Procedures/Studies: Dg Chest 2 View  Result Date: 11/06/2017 CLINICAL DATA:  Weight loss over the past 2 months EXAM: CHEST - 2 VIEW COMPARISON:  None. FINDINGS: Cardiac shadow is within normal limits. Aortic calcifications are noted. Nodular changes are noted within the lungs bilaterally as well as extensive right upper lobe infiltrative change. CT of the chest is recommended with contrast material for further evaluation. IMPRESSION: Nodular changes with extensive right upper lobe infiltrate. CT of the chest is recommended with contrast for further evaluation. Electronically Signed   By: Alcide Clever M.D.   On: 11/06/2017 19:07   Dg Chest Portable 1 View  Result Date: 11/11/2017 CLINICAL DATA:  Cough for 4 weeks. 20 pound weight loss. Recent travel to [place] in November 2018. EXAM: PORTABLE CHEST 1 VIEW COMPARISON:  11/05/2017 FINDINGS: There is persistent right upper lobe airspace consolidation and volume loss with retraction of the right hilum. Possible right hilar adenopathy. Multiple other nodular densities are noted in the left lung. No significant change. Slight improved aeration of the right lung base. No pleural effusions. IMPRESSION: Persistent right upper lobe  airspace process and scattered nodular lung opacities. Findings could be due to atypical infection such as TB. Neoplasm is also possible. Electronically Signed   By: Rudie Meyer M.D.   On: 11/11/2017 18:24   Dg Finger Middle Left  Result Date: 11/06/2017 CLINICAL DATA:  Third digit ulcer EXAM: LEFT MIDDLE FINGER 2+V COMPARISON:  None. FINDINGS: Considerable soft tissue swelling is noted distally in the third digit. Some erosive changes are noted in the distal aspect of the middle phalanx as well as in the proximal aspect of the distal phalanx. These changes are consistent with osteomyelitis. IMPRESSION: Changes consistent with osteomyelitis in the distal aspect of the third digit. Electronically Signed   By: Alcide Clever M.D.   On: 11/06/2017 19:08      Subjective: Feeling better cough improved.   Discharge Exam: Vitals:   11/14/17 1206 11/14/17 2049  BP: 137/87 135/90  Pulse: 90 85  Resp: 16   Temp: 98.5 F (36.9 C) 98.5 F (36.9 C)  SpO2: 100% 100%   Vitals:   11/13/17 1300 11/13/17 2233 11/14/17 1206 11/14/17 2049  BP: 137/87 (!) 146/91 137/87 135/90  Pulse: 87 93 90 85  Resp: 16 16 16    Temp: 98.6  F (37 C) 99.3 F (37.4 C) 98.5 F (36.9 C) 98.5 F (36.9 C)  TempSrc: Oral Oral Oral Oral  SpO2: 100% 100% 100% 100%  Weight:      Height:        General: Pt is alert, awake, not in acute distress Cardiovascular: RRR, S1/S2 +, no rubs, no gallops Respiratory: CTA bilaterally, no wheezing, no rhonchi Abdominal: Soft, NT, ND, bowel sounds + Extremities: no edema, no cyanosis    The results of significant diagnostics from this hospitalization (including imaging, microbiology, ancillary and laboratory) are listed below for reference.     Microbiology: Recent Results (from the past 240 hour(s))  Blood culture (routine x 2)     Status: None (Preliminary result)   Collection Time: 11/11/17  6:45 PM  Result Value Ref Range Status   Specimen Description BLOOD LEFT WRIST   Final   Special Requests   Final    BOTTLES DRAWN AEROBIC AND ANAEROBIC Blood Culture adequate volume   Culture   Final    NO GROWTH 3 DAYS Performed at [facility], [address] [place], [place] [address]    Report Status PENDING  Incomplete  Urine culture     Status: None   Collection Time: 11/11/17  6:52 PM  Result Value Ref Range Status   Specimen Description URINE, CLEAN CATCH  Final   Special Requests NONE  Final   Culture   Final    NO GROWTH Performed at [facility], [address] [place], [place] [address]    Report Status 11/12/2017 FINAL  Final  Blood culture (routine x 2)     Status: None (Preliminary result)   Collection Time: 11/11/17  8:22 PM  Result Value Ref Range Status   Specimen Description BLOOD RIGHT FOREARM  Final   Special Requests   Final    BOTTLES DRAWN AEROBIC AND ANAEROBIC Blood Culture adequate volume   Culture   Final    NO GROWTH 3 DAYS Performed at [facility], [address] [place], [place] [address]    Report Status PENDING  Incomplete  Respiratory Panel by PCR     Status: None   Collection Time: 11/11/17 10:12 PM  Result Value Ref Range Status   Adenovirus NOT DETECTED NOT DETECTED Final   Coronavirus 229E NOT DETECTED NOT DETECTED Final   Coronavirus HKU1 NOT DETECTED NOT DETECTED Final   Coronavirus NL63 NOT DETECTED NOT DETECTED Final   Coronavirus OC43 NOT DETECTED NOT DETECTED Final   Metapneumovirus NOT DETECTED NOT DETECTED Final   Rhinovirus / Enterovirus NOT DETECTED NOT DETECTED Final   Influenza A NOT DETECTED NOT DETECTED Final   Influenza B NOT DETECTED NOT DETECTED Final   Parainfluenza Virus 1 NOT DETECTED NOT DETECTED Final   Parainfluenza Virus 2 NOT DETECTED NOT DETECTED Final   Parainfluenza Virus 3 NOT DETECTED NOT DETECTED Final   Parainfluenza Virus 4 NOT DETECTED NOT DETECTED Final   Respiratory Syncytial Virus NOT DETECTED NOT DETECTED Final   Bordetella pertussis NOT DETECTED NOT  DETECTED Final   Chlamydophila pneumoniae NOT DETECTED NOT DETECTED Final   Mycoplasma pneumoniae NOT DETECTED NOT DETECTED Final    Comment: Performed at [facility], [address] [place], [place] [address]  Acid Fast Smear (AFB)     Status: None   Collection Time: 11/12/17  7:47 AM  Result Value Ref Range Status   AFB Specimen Processing Concentration  Final   Acid  Fast Smear Positive  Final    Comment: CRITICAL RESULT CALLED TO, READ BACK BY AND VERIFIED WITH: DR Luciana Axe AT 8295 11/15/17 BY L BENFIELD (NOTE) 3+, 4-36 acid-fast bacilli per field at 400X magnification, fluorescent smear Called/faxed result to Geoffery Lyons. at 6213 04.15.19 AMD Performed At: [facility] [address] [place], [place] [id number] Jolene Schimke MD GE:[id number]    Source (AFB) SPUTUM  Final    Comment: Performed at [facility], [address], [place], [place] [address]  Fungus Culture With Stain     Status: None (Preliminary result)   Collection Time: 11/12/17  7:47 AM  Result Value Ref Range Status   Fungus Stain Final report  Final    Comment: (NOTE) Performed At: [facility] [address] [place], [place] [id number] Jolene Schimke MD KV:[id number]    Fungus (Mycology) Culture PENDING  Incomplete   Fungal Source SPUTUM  Final    Comment: Performed at [facility], [address], [place], [place] [address]  Culture, expectorated sputum-assessment     Status: None   Collection Time: 11/12/17  7:47 AM  Result Value Ref Range Status   Specimen Description SPUTUM  Final   Special Requests Normal  Final   Sputum evaluation   Final    Sputum specimen not acceptable for testing.  Please recollect.   Results Called to: Tobey Grim, ED CHARGE RN AT 0900 ON 11/12/17 BY C. JESSUP, MLT. Performed at [facility], [address], [place], [place] [address]    Report Status 11/12/2017 FINAL  Final  Fungus Culture Result     Status: None   Collection Time: 11/12/17   7:47 AM  Result Value Ref Range Status   Result 1 Comment  Final    Comment: (NOTE) Fungal elements, such as arthroconidia, hyphal fragments, chlamydoconidia, observed. Performed At: [facility] [address] [place], [place] [id number] Jolene Schimke MD SW:[id number] Performed at [facility], [address], [place], [place] [address]   Culture, expectorated sputum-assessment     Status: None   Collection Time: 11/12/17  9:57 AM  Result Value Ref Range Status   Specimen Description SPUTUM  Final   Special Requests Normal  Final   Sputum evaluation   Final    THIS SPECIMEN IS ACCEPTABLE FOR SPUTUM CULTURE Performed at [facility], [address], [place], [place] [address]    Report Status 11/12/2017 FINAL  Final  Culture, respiratory (NON-Expectorated)     Status: None   Collection Time: 11/12/17  9:57 AM  Result Value Ref Range Status   Specimen Description SPUTUM  Final   Special Requests Normal Reflexed from [id number]  Final   Gram Stain   Final    ABUNDANT WBC PRESENT, PREDOMINANTLY PMN RARE SQUAMOUS EPITHELIAL CELLS PRESENT RARE GRAM POSITIVE COCCI IN PAIRS RARE GRAM NEGATIVE RODS    Culture   Final    Consistent with normal respiratory flora. Performed at [facility], [address] [place], [place] [address]    Report Status 11/14/2017 FINAL  Final     Labs: BNP (last 3 results) No results for input(s): BNP in the last 8760 hours. Basic Metabolic Panel: Recent Labs  Lab 11/11/17 1721 11/12/17 1103 11/13/17 0507  NA 136 138 139  K 3.4* 3.7 3.7  CL 99* 106 108  CO2 26 23 22   GLUCOSE 114* 186* 109*  BUN 7 5* 7  CREATININE 0.84 0.71 0.66  CALCIUM 9.6 8.7* 8.9   Liver Function Tests: Recent Labs  Lab 11/11/17 1721  AST 26  ALT 20  ALKPHOS 72  BILITOT 0.6  PROT 7.5  ALBUMIN 3.5   No results for input(s): LIPASE, AMYLASE in the last 168 hours. No results for input(s): AMMONIA in the last 168 hours. CBC: Recent Labs   Lab 11/11/17 1721 11/12/17 1103 11/13/17 0507  WBC 10.1 10.2 9.2  NEUTROABS 6.9  --   --   HGB 11.2* 10.1* 9.7*  HCT 34.5* 31.6* 30.5*  MCV 91.8 91.3 91.0  PLT 488* 404* 437*   Cardiac Enzymes: No results for input(s): CKTOTAL, CKMB, CKMBINDEX, TROPONINI in the last 168 hours. BNP: Invalid input(s): POCBNP CBG: Recent Labs  Lab 11/14/17 0722 11/14/17 1124 11/14/17 2047 11/15/17 0627 11/15/17 1109  GLUCAP 117* 163* 192* 233* 182*   D-Dimer No results for input(s): DDIMER in the last 72 hours. Hgb A1c No results for input(s): HGBA1C in the last 72 hours. Lipid Profile No results for input(s): CHOL, HDL, LDLCALC, TRIG, CHOLHDL, LDLDIRECT in the last 72 hours. Thyroid function studies No results for input(s): TSH, T4TOTAL, T3FREE, THYROIDAB in the last 72 hours.  Invalid input(s): FREET3 Anemia work up No results for input(s): VITAMINB12, FOLATE, FERRITIN, TIBC, IRON, RETICCTPCT in the last 72 hours. Urinalysis    Component Value Date/Time   COLORURINE STRAW (A) 11/11/2017 1744   APPEARANCEUR CLEAR 11/11/2017 1744   LABSPEC 1.002 (L) 11/11/2017 1744   PHURINE 8.0 11/11/2017 1744   GLUCOSEU NEGATIVE 11/11/2017 1744   HGBUR NEGATIVE 11/11/2017 1744   BILIRUBINUR NEGATIVE 11/11/2017 1744   KETONESUR NEGATIVE 11/11/2017 1744   PROTEINUR NEGATIVE 11/11/2017 1744   NITRITE NEGATIVE 11/11/2017 1744   LEUKOCYTESUR NEGATIVE 11/11/2017 1744   Sepsis Labs Invalid input(s): PROCALCITONIN,  WBC,  LACTICIDVEN Microbiology Recent Results (from the past 240 hour(s))  Blood culture (routine x 2)     Status: None (Preliminary result)   Collection Time: 11/11/17  6:45 PM  Result Value Ref Range Status   Specimen Description BLOOD LEFT WRIST  Final   Special Requests   Final    BOTTLES DRAWN AEROBIC AND ANAEROBIC Blood Culture adequate volume   Culture   Final    NO GROWTH 3 DAYS Performed at [facility], [address] [place], [place] [id number]    Report Status  PENDING  Incomplete  Urine culture     Status: None   Collection Time: 11/11/17  6:52 PM  Result Value Ref Range Status   Specimen Description URINE, CLEAN CATCH  Final   Special Requests NONE  Final   Culture   Final    NO GROWTH Performed at [facility], [address] [place], [place] [address]    Report Status 11/12/2017 FINAL  Final  Blood culture (routine x 2)     Status: None (Preliminary result)   Collection Time: 11/11/17  8:22 PM  Result Value Ref Range Status   Specimen Description BLOOD RIGHT FOREARM  Final   Special Requests   Final    BOTTLES DRAWN AEROBIC AND ANAEROBIC Blood Culture adequate volume   Culture   Final    NO GROWTH 3 DAYS Performed at [facility], [address] [place], [place] [address]    Report Status PENDING  Incomplete  Respiratory Panel by PCR     Status: None   Collection Time: 11/11/17 10:12 PM  Result Value Ref Range Status   Adenovirus NOT  DETECTED NOT DETECTED Final   Coronavirus 229E NOT DETECTED NOT DETECTED Final   Coronavirus HKU1 NOT DETECTED NOT DETECTED Final   Coronavirus NL63 NOT DETECTED NOT DETECTED Final   Coronavirus OC43 NOT DETECTED NOT DETECTED Final   Metapneumovirus NOT DETECTED NOT DETECTED Final   Rhinovirus / Enterovirus NOT DETECTED NOT DETECTED Final   Influenza A NOT DETECTED NOT DETECTED Final   Influenza B NOT DETECTED NOT DETECTED Final   Parainfluenza Virus 1 NOT DETECTED NOT DETECTED Final   Parainfluenza Virus 2 NOT DETECTED NOT DETECTED Final   Parainfluenza Virus 3 NOT DETECTED NOT DETECTED Final   Parainfluenza Virus 4 NOT DETECTED NOT DETECTED Final   Respiratory Syncytial Virus NOT DETECTED NOT DETECTED Final   Bordetella pertussis NOT DETECTED NOT DETECTED Final   Chlamydophila pneumoniae NOT DETECTED NOT DETECTED Final   Mycoplasma pneumoniae NOT DETECTED NOT DETECTED Final    Comment: Performed at [facility], [address], [place], [place] [address]  Acid Fast Smear  (AFB)     Status: None   Collection Time: 11/12/17  7:47 AM  Result Value Ref Range Status   AFB Specimen Processing Concentration  Final   Acid Fast Smear Positive  Final    Comment: CRITICAL RESULT CALLED TO, READ BACK BY AND VERIFIED WITH: DR Luciana Axe AT 6045 11/15/17 BY L BENFIELD (NOTE) 3+, 4-36 acid-fast bacilli per field at 400X magnification, fluorescent smear Called/faxed result to Geoffery Lyons. at 4098 04.15.19 AMD Performed At: [facility] [address] [place], [place] [id number] Jolene Schimke MD FA:[id number]    Source (AFB) SPUTUM  Final    Comment: Performed at [facility], [address] [place], [place] [address]  Fungus Culture With Stain     Status: None (Preliminary result)   Collection Time: 11/12/17  7:47 AM  Result Value Ref Range Status   Fungus Stain Final report  Final    Comment: (NOTE) Performed At: [facility] [address] [place], [place] [id number] Jolene Schimke MD WN:[id number]    Fungus (Mycology) Culture PENDING  Incomplete   Fungal Source SPUTUM  Final    Comment: Performed at [facility], [address], [place], [place] [address]  Culture, expectorated sputum-assessment     Status: None   Collection Time: 11/12/17  7:47 AM  Result Value Ref Range Status   Specimen Description SPUTUM  Final   Special Requests Normal  Final   Sputum evaluation   Final    Sputum specimen not acceptable for testing.  Please recollect.   Results Called to: Tobey Grim, ED CHARGE RN AT 0900 ON 11/12/17 BY C. JESSUP, MLT. Performed at [facility], [address], [place], [place] [address]    Report Status 11/12/2017 FINAL  Final  Fungus Culture Result     Status: None   Collection Time: 11/12/17  7:47 AM  Result Value Ref Range Status   Result 1 Comment  Final    Comment: (NOTE) Fungal elements, such as arthroconidia, hyphal fragments, chlamydoconidia, observed. Performed At: [facility] [address]  [place], [place] [id number] Jolene Schimke MD [id number]:[id number] Performed at [facility], [address], [place], [place] [address]   Culture, expectorated sputum-assessment     Status: None   Collection Time: 11/12/17  9:57 AM  Result Value Ref Range Status   Specimen Description SPUTUM  Final   Special Requests Normal  Final   Sputum evaluation   Final  THIS SPECIMEN IS ACCEPTABLE FOR SPUTUM CULTURE Performed at [facility], [address] [place], [place] [address]    Report Status 11/12/2017 FINAL  Final  Culture, respiratory (NON-Expectorated)     Status: None   Collection Time: 11/12/17  9:57 AM  Result Value Ref Range Status   Specimen Description SPUTUM  Final   Special Requests Normal Reflexed from [id number]  Final   Gram Stain   Final    ABUNDANT WBC PRESENT, PREDOMINANTLY PMN RARE SQUAMOUS EPITHELIAL CELLS PRESENT RARE GRAM POSITIVE COCCI IN PAIRS RARE GRAM NEGATIVE RODS    Culture   Final    Consistent with normal respiratory flora. Performed at [facility], [address], [place], [place] [address]    Report Status 11/14/2017 FINAL  Final     Time coordinating discharge: 35 minutes,   SIGNED:   Alba Cory, MD  [facility] 11/15/2017, 11:51 AM Pager [phone]  If 7PM-7AM, please contact night-coverage [email or web address] Password [email or web address]

## 2017-11-16 ENCOUNTER — Other Ambulatory Visit: Payer: BLUE CROSS/BLUE SHIELD

## 2017-11-16 LAB — CULTURE, BLOOD (ROUTINE X 2)
CULTURE: NO GROWTH
Culture: NO GROWTH
Special Requests: ADEQUATE
Special Requests: ADEQUATE

## 2017-11-16 LAB — MTB NAA WITHOUT AFB CULTURE: M Tuberculosis, Naa: NEGATIVE

## 2017-12-10 LAB — FUNGUS CULTURE WITH STAIN

## 2017-12-10 LAB — FUNGUS CULTURE RESULT

## 2017-12-10 LAB — FUNGAL ORGANISM REFLEX

## 2018-01-04 LAB — MTB SUSCEPTIBILITY BROTH, REFLEXED

## 2018-01-04 LAB — AFB ORGANISM ID BY DNA PROBE
M avium complex: NEGATIVE
M tuberculosis complex: POSITIVE — AB

## 2018-01-04 LAB — ACID FAST CULTURE WITH REFLEXED SENSITIVITIES (MYCOBACTERIA)

## 2018-01-04 LAB — ACID FAST CULTURE WITH REFLEXED SENSITIVITIES: ACID FAST CULTURE - AFSCU3: POSITIVE — AB

## 2018-01-13 ENCOUNTER — Ambulatory Visit
Admission: RE | Admit: 2018-01-13 | Discharge: 2018-01-13 | Disposition: A | Discharge status: Home / Self Care | Payer: No Typology Code available for payment source | Source: Ambulatory Visit | Attending: Internal Medicine | Admitting: Internal Medicine

## 2018-01-13 ENCOUNTER — Other Ambulatory Visit: Payer: Self-pay | Admitting: Internal Medicine

## 2018-01-13 DIAGNOSIS — Z111 Encounter for screening for respiratory tuberculosis: Secondary | ICD-10-CM

## 2018-05-13 ENCOUNTER — Other Ambulatory Visit: Payer: Self-pay | Admitting: Internal Medicine

## 2018-05-13 ENCOUNTER — Ambulatory Visit
Admission: RE | Admit: 2018-05-13 | Discharge: 2018-05-13 | Disposition: A | Discharge status: Home / Self Care | Payer: No Typology Code available for payment source | Source: Ambulatory Visit | Attending: Internal Medicine | Admitting: Internal Medicine

## 2018-05-13 DIAGNOSIS — Z09 Encounter for follow-up examination after completed treatment for conditions other than malignant neoplasm: Secondary | ICD-10-CM

## 2019-02-16 IMAGING — DX DG CHEST 1V PORT
1 series · 1 of 1 positions shown · non-contrast
Comparison: 11/05/2017

CLINICAL DATA: Cough for 4 weeks. 20 pound weight loss. Recent
travel to [place] in June 2017.

EXAM:
PORTABLE CHEST 1 VIEW

[chest ap]
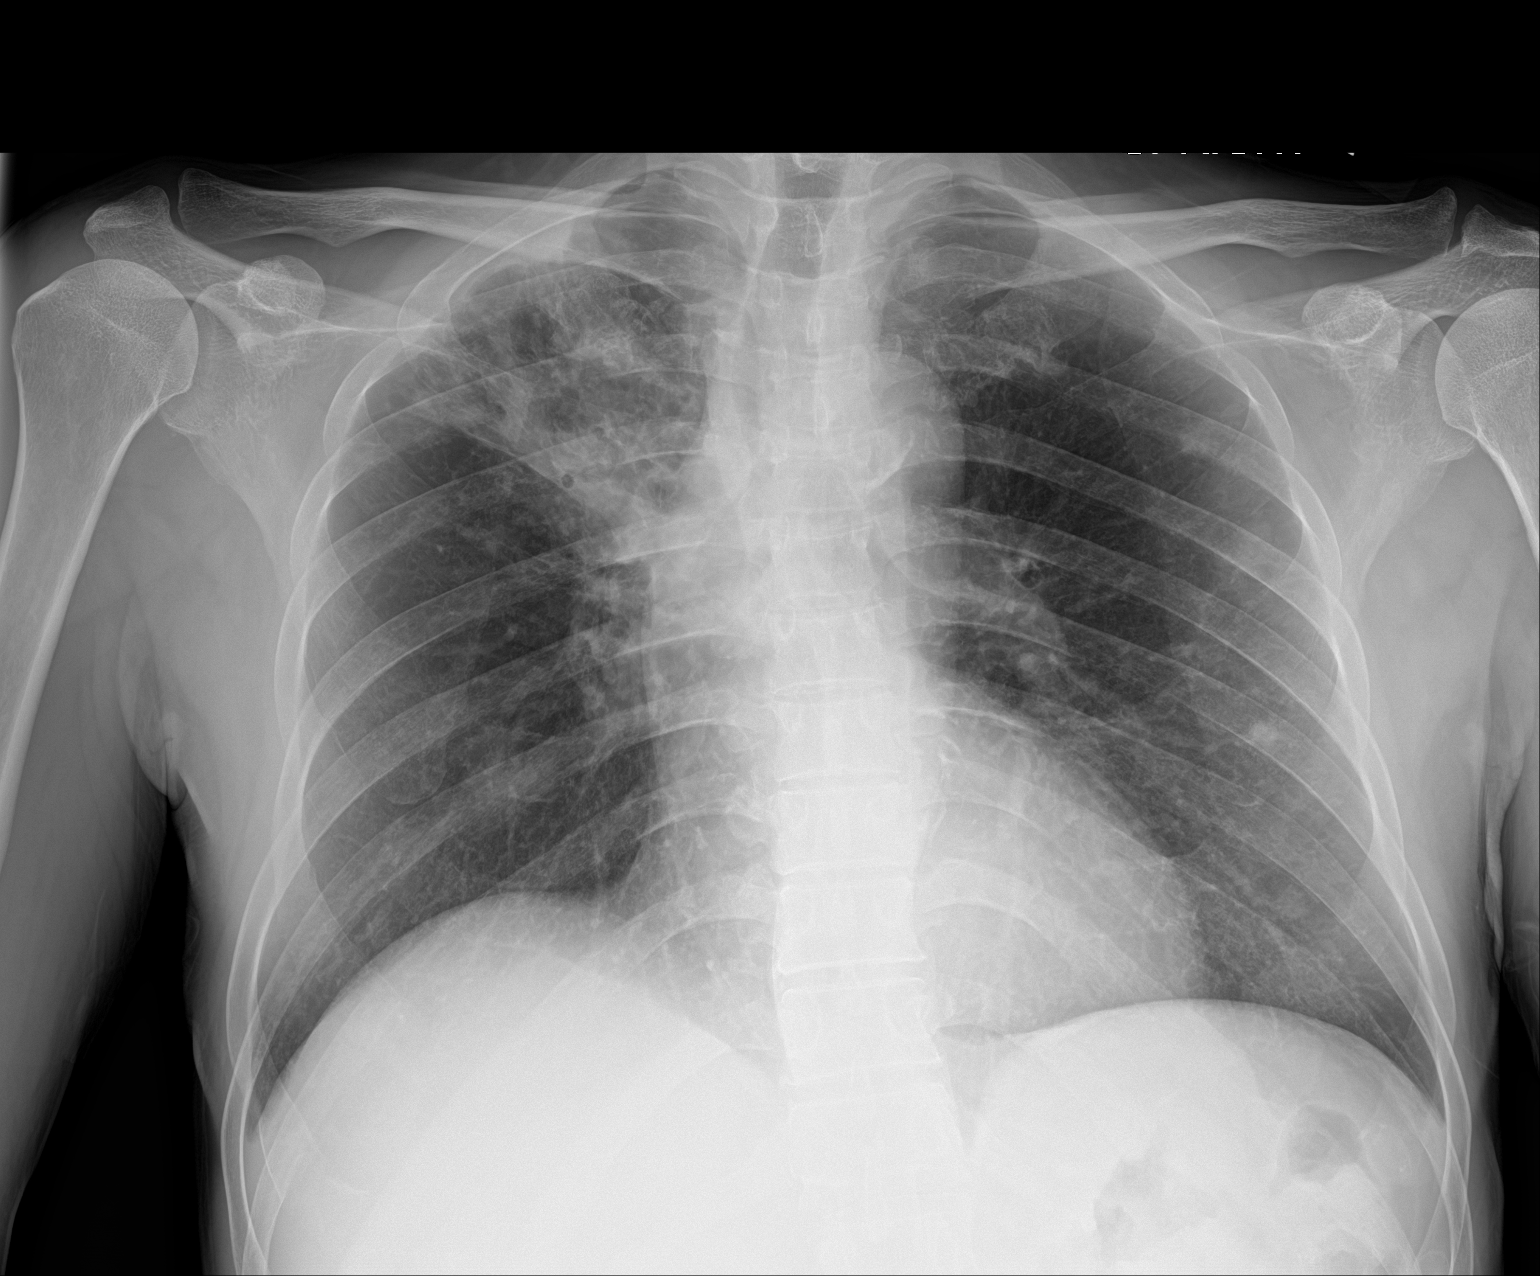

[1 of 1 positions shown; findings below may reference images not displayed]

FINDINGS: There is persistent right upper lobe airspace consolidation and
volume loss with retraction of the right hilum. Possible right hilar
adenopathy. Multiple other nodular densities are noted in the left
lung. No significant change. Slight improved aeration of the right
lung base. No pleural effusions.
IMPRESSION: Persistent right upper lobe airspace process and scattered nodular
lung opacities. Findings could be due to atypical infection such as
TB. Neoplasm is also possible.

## 2019-04-20 IMAGING — CR DG CHEST 1V
1 series · 1 of 1 positions shown · non-contrast
Comparison: November 11, 2017

CLINICAL DATA: Positive tuberculin skin test

EXAM:
CHEST  1 VIEW

[w chest pa]
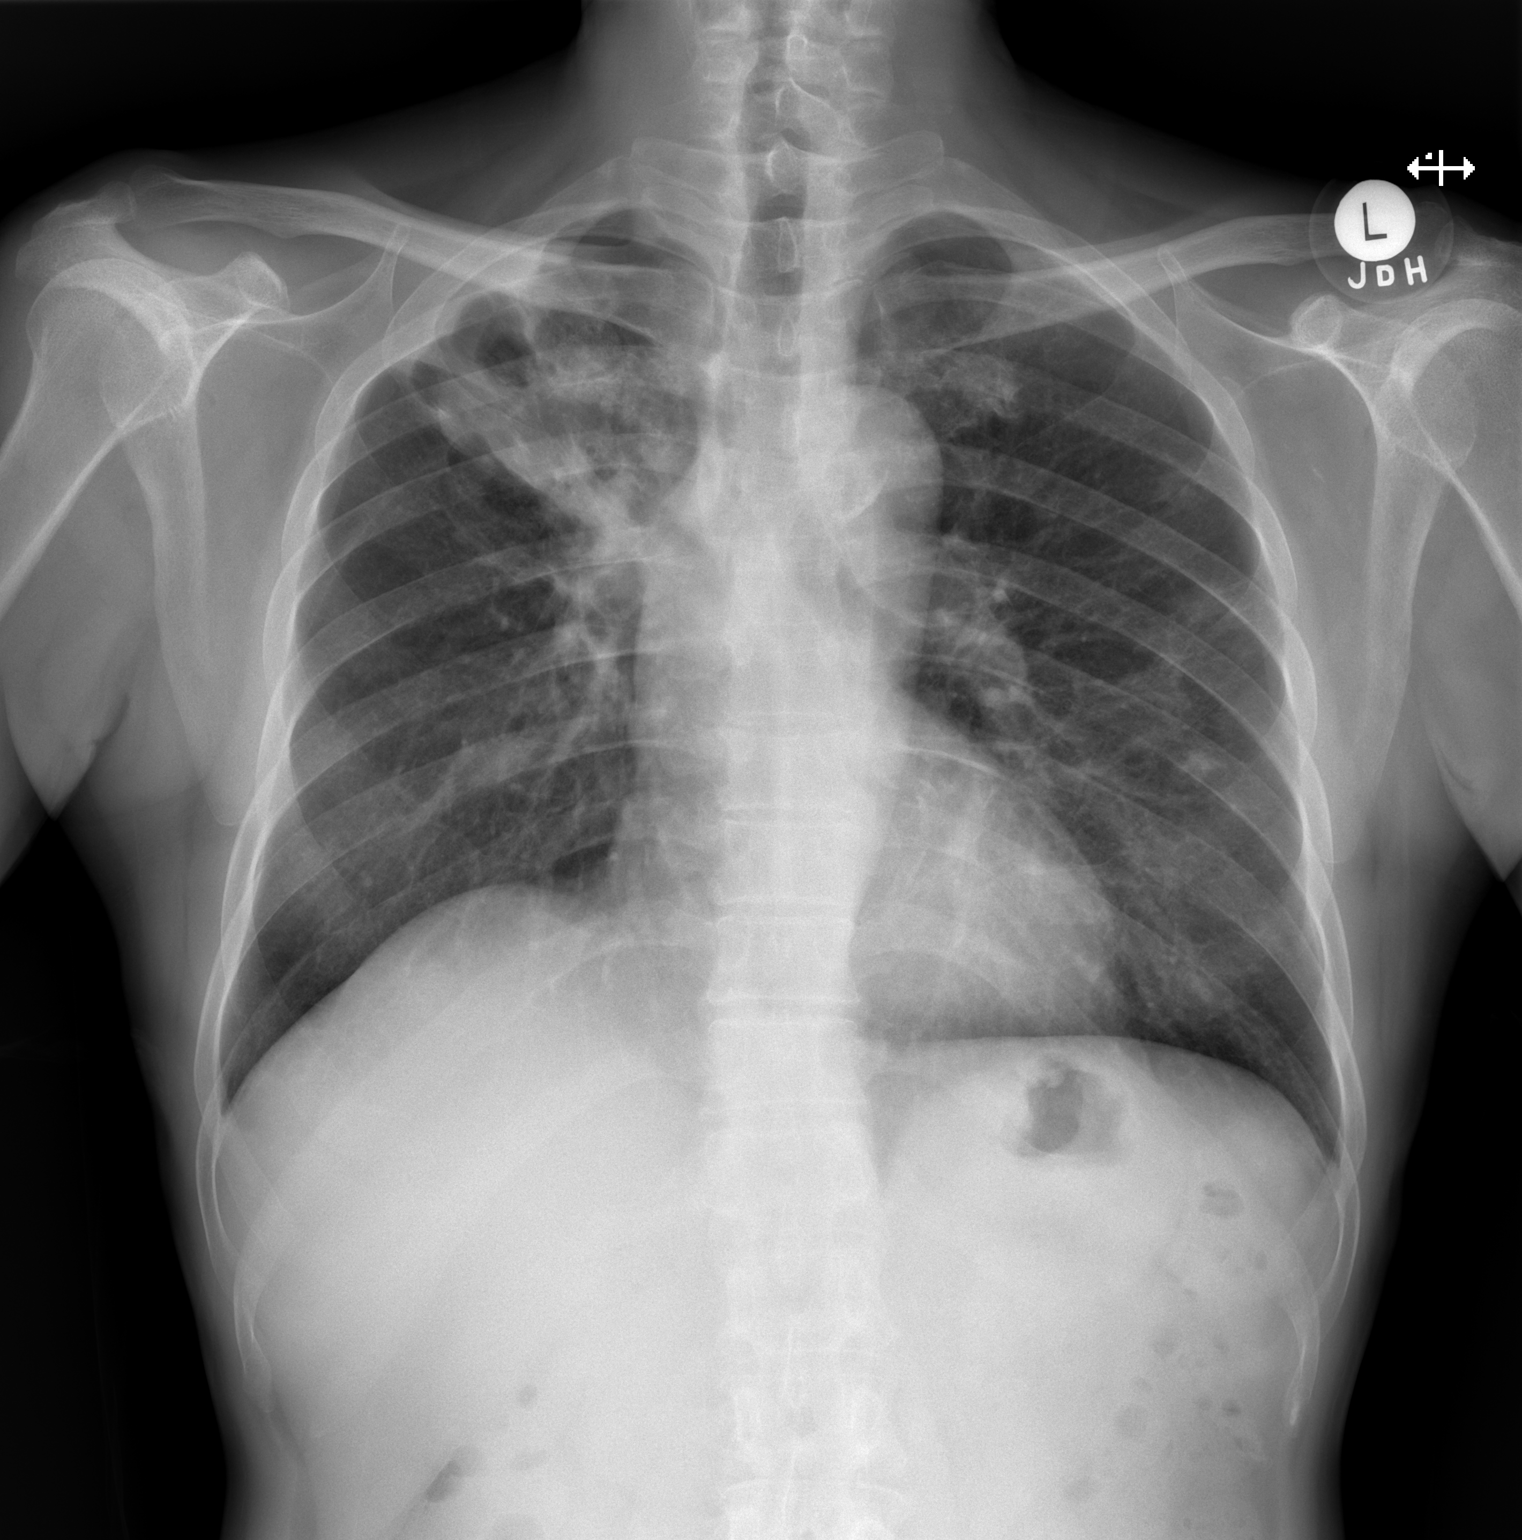

[1 of 1 positions shown; findings below may reference images not displayed]

FINDINGS: There remains scarring in the right upper lobe with volume loss.
There are several ill-defined nodular opacities in the left mid and
upper lung zones, essentially stable. No new opacity evident. Heart
size and pulmonary vascularity are normal. No adenopathy. There is
aortic atherosclerosis. There are no apparent bone lesions.
IMPRESSION: Stable appearance compared to 2 months prior. Scarring with volume
loss right upper lobe. Somewhat ill-defined nodular opacities in the
left lung measuring up to approximately 1 cm in size. No new opacity
evident. No adenopathy evident. There is aortic atherosclerosis.

Tuberculosis could account for the present findings. Neoplasm
remains possible as a differential consideration. Close clinical and
imaging surveillance advised. Given the persistence of findings,
bronchoscopy may be a reasonable consideration.

## 2023-02-08 ENCOUNTER — Other Ambulatory Visit: Payer: Self-pay | Admitting: Psychiatry

## 2023-02-09 LAB — CBC WITH DIFFERENTIAL/PLATELET
Basophils Absolute: 0.1 10*3/uL (ref 0.0–0.2)
Basos: 1 %
EOS (ABSOLUTE): 0.5 10*3/uL — ABNORMAL HIGH (ref 0.0–0.4)
Eos: 8 %
Hematocrit: 35.4 % — ABNORMAL LOW (ref 37.5–51.0)
Hemoglobin: 12 g/dL — ABNORMAL LOW (ref 13.0–17.7)
Immature Grans (Abs): 0 10*3/uL (ref 0.0–0.1)
Immature Granulocytes: 0 %
Lymphocytes Absolute: 1.9 10*3/uL (ref 0.7–3.1)
Lymphs: 28 %
MCH: 33.9 pg — ABNORMAL HIGH (ref 26.6–33.0)
MCHC: 33.9 g/dL (ref 31.5–35.7)
MCV: 100 fL — ABNORMAL HIGH (ref 79–97)
Monocytes Absolute: 0.6 10*3/uL (ref 0.1–0.9)
Monocytes: 10 %
Neutrophils Absolute: 3.6 10*3/uL (ref 1.4–7.0)
Neutrophils: 53 %
Platelets: 318 10*3/uL (ref 150–450)
RBC: 3.54 x10E6/uL — ABNORMAL LOW (ref 4.14–5.80)
RDW: 11.6 % (ref 11.6–15.4)
WBC: 6.7 10*3/uL (ref 3.4–10.8)
